# Patient Record
Sex: Female | Born: 1960 | Race: White | Hispanic: No | Marital: Married | Smoking: Never smoker
Health system: Southern US, Community
[De-identification: ages and names within clinical notes are randomized; demographics above are authoritative.]

## PROBLEM LIST (undated history)

## (undated) DIAGNOSIS — E7800 Pure hypercholesterolemia, unspecified: Secondary | ICD-10-CM

## (undated) DIAGNOSIS — G4709 Other insomnia: Secondary | ICD-10-CM

## (undated) DIAGNOSIS — M8580 Other specified disorders of bone density and structure, unspecified site: Secondary | ICD-10-CM

## (undated) DIAGNOSIS — J302 Other seasonal allergic rhinitis: Secondary | ICD-10-CM

## (undated) DIAGNOSIS — C44519 Basal cell carcinoma of skin of other part of trunk: Secondary | ICD-10-CM

## (undated) DIAGNOSIS — I10 Essential (primary) hypertension: Secondary | ICD-10-CM

## (undated) DIAGNOSIS — M797 Fibromyalgia: Secondary | ICD-10-CM

## (undated) DIAGNOSIS — J189 Pneumonia, unspecified organism: Secondary | ICD-10-CM

## (undated) DIAGNOSIS — M1990 Unspecified osteoarthritis, unspecified site: Secondary | ICD-10-CM

## (undated) DIAGNOSIS — K219 Gastro-esophageal reflux disease without esophagitis: Secondary | ICD-10-CM

## (undated) DIAGNOSIS — I700 Atherosclerosis of aorta: Secondary | ICD-10-CM

## (undated) HISTORY — PX: SKIN CANCER EXCISION: SHX779

## (undated) HISTORY — DX: Other seasonal allergic rhinitis: J30.2

## (undated) HISTORY — DX: Pure hypercholesterolemia, unspecified: E78.00

## (undated) HISTORY — PX: KNEE SURGERY: SHX244

## (undated) HISTORY — DX: Fibromyalgia: M79.7

## (undated) HISTORY — DX: Other specified disorders of bone density and structure, unspecified site: M85.80

## (undated) HISTORY — DX: Unspecified osteoarthritis, unspecified site: M19.90

## (undated) HISTORY — DX: Atherosclerosis of aorta: I70.0

## (undated) HISTORY — DX: Other insomnia: G47.09

---

## 1999-09-21 ENCOUNTER — Other Ambulatory Visit: Admission: RE | Admit: 1999-09-21 | Discharge: 1999-09-21 | Payer: Self-pay | Admitting: Gynecology

## 2000-11-19 HISTORY — PX: HYSTEROTOMY: SHX1776

## 2001-01-30 ENCOUNTER — Other Ambulatory Visit: Admission: RE | Admit: 2001-01-30 | Discharge: 2001-01-30 | Payer: Self-pay | Admitting: Gynecology

## 2001-11-19 HISTORY — PX: TUBAL LIGATION: SHX77

## 2001-11-19 HISTORY — PX: OVARY SURGERY: SHX727

## 2002-03-11 ENCOUNTER — Other Ambulatory Visit: Admission: RE | Admit: 2002-03-11 | Discharge: 2002-03-11 | Payer: Self-pay | Admitting: Gynecology

## 2002-05-13 ENCOUNTER — Inpatient Hospital Stay (HOSPITAL_COMMUNITY): Admission: RE | Admit: 2002-05-13 | Discharge: 2002-05-15 | Payer: Self-pay | Admitting: Gynecology

## 2002-05-13 ENCOUNTER — Encounter (INDEPENDENT_AMBULATORY_CARE_PROVIDER_SITE_OTHER): Payer: Self-pay | Admitting: Specialist

## 2003-08-23 ENCOUNTER — Other Ambulatory Visit: Admission: RE | Admit: 2003-08-23 | Discharge: 2003-08-23 | Payer: Self-pay | Admitting: Gynecology

## 2003-10-06 ENCOUNTER — Ambulatory Visit (HOSPITAL_BASED_OUTPATIENT_CLINIC_OR_DEPARTMENT_OTHER): Admission: RE | Admit: 2003-10-06 | Discharge: 2003-10-06 | Payer: Self-pay | Admitting: Gynecology

## 2003-10-06 ENCOUNTER — Encounter (INDEPENDENT_AMBULATORY_CARE_PROVIDER_SITE_OTHER): Payer: Self-pay | Admitting: Specialist

## 2003-10-06 ENCOUNTER — Ambulatory Visit (HOSPITAL_COMMUNITY): Admission: RE | Admit: 2003-10-06 | Discharge: 2003-10-06 | Payer: Self-pay | Admitting: Gynecology

## 2005-02-22 ENCOUNTER — Other Ambulatory Visit: Admission: RE | Admit: 2005-02-22 | Discharge: 2005-02-22 | Payer: Self-pay | Admitting: Gynecology

## 2006-02-26 ENCOUNTER — Other Ambulatory Visit: Admission: RE | Admit: 2006-02-26 | Discharge: 2006-02-26 | Payer: Self-pay | Admitting: Gynecology

## 2008-06-10 ENCOUNTER — Other Ambulatory Visit: Admission: RE | Admit: 2008-06-10 | Discharge: 2008-06-10 | Payer: Self-pay | Admitting: Gynecology

## 2010-07-11 ENCOUNTER — Other Ambulatory Visit: Admission: RE | Admit: 2010-07-11 | Discharge: 2010-07-11 | Payer: Self-pay | Admitting: Gynecology

## 2010-07-11 ENCOUNTER — Ambulatory Visit: Payer: Self-pay | Admitting: Gynecology

## 2011-04-06 NOTE — Op Note (Signed)
[facility]  Patient:    Desiree Randall, Desiree Randall Visit Number: [id number] MRN: [id number]          Service Type: GYN Location: 9300 [id number] [id number] Attending Physician:  Tonye Royalty Dictated by:   Gaetano Hawthorne. Lily Peer, M.D. Proc. Date: 05/13/02 Admit Date:  05/13/2002                             Operative Report  SURGEON:                      Juan H. Lily Peer, M.D.  FIRST ASSISTANT:              Katy Fitch, M.D.  INDICATION FOR OPERATION:     A 50 year old gravida 3, para 2, AB 1, with chronic symptomatic leiomyomatous uteri contributing to her iron deficiency anemia, dysmenorrhea and menorrhagia.  PREOPERATIVE DIAGNOSIS:       1. Symptomatic leiomyomatous uteri (dysmenorrhea and menorrhagia).                               2. Anemia.  POSTOPERATIVE DIAGNOSES:      1. Symptomatic leiomyomatous uteri (dysmenorrhea and menorrhagia).  ANESTHESIA:                   General endotracheal.  PROCEDURES PERFORMED:         Transabdominal hysterectomy with bilateral oophoropexy.  FINDINGS:                     Mild pelvic adhesions.  Leiomyomatous uteri and a left peritubal cyst.  Normal appearing ovaries.  DESCRIPTION OF PROCEDURE:     After the patient was adequately counseled, she was taken to the operating room where she underwent a successful general endotracheal anesthesia.  She was placed in a supine position and the abdomen and vagina were prepped and draped in the usual sterile fashion.  Exam under anesthesia demonstrated 8-10 week sized irregular uterus, thus determining that the appropriate incision would be a Pfannenstiel.  After the Foley catheter was in the place and drapes were in place, a Pfannenstiel skin incision was made 2 cm above the symphysis pubis.  The incision was carried out from skin, subcutaneous tissue and down to the rectus fascia.  A midline nick was made, the fascia was incised in transverse fashion.  The  peritoneal cavity was entered cautiously and the patient was placed in the Trendelenburg position.  The OConnor-Sullivan retractor was placed.  After some mild adhesiolysis in the pelvis, with two Kelly clamps placed and hugging the uterus, the area of the triple pedicle was utilized for traction.  The right round ligament was identified and was transected with Bovie cautery.  The bladder flap was then established by incising the anterior broad ligament down to the level of the internal cervical os.  The right ureter was identified and the surgeons finger penetrated posterior broad ligament.  Then two Kelly clamps hugged the uterus and the utero-ovarian ligament and proximal tube were transected; leaving the right tube and ovary behind.  The pedicle was free tied with 0 Vicryl suture, followed by transfixation of stitches using 0 Vicryl suture.  The parametrial tissue was skeletonized and the remaining broad and cardinal ligaments were serially clamped, cut and suture ligated with 0 Vicryl suture, to the level of the right vaginal fornix.  A  similar procedure was carried down on the contralateral side.  There a left peritubal cyst was excised and passed off the operative field.  Both fornices were entered and the cervix was excised with the vagina passed off the operative field.  Both angles were secured with transfixation stitch of 0 Vicryl suture.  The remaining cuff was secured with interrupted stitches of 0 Vicryl suture. The pelvis was copiously irrigated with normal saline solution; for additional hemostasis Surgicel was placed, due to some mild oozing.  That was contained with additional hemostasis that was placed.  Both ovaries were suspended to the round ligament with 3-0 Vicryl suture.  The sponge and needle count were correct.  The OConnor-Sullivan retractor was removed, and the visceroperitoneum was then reapproximated.  The rectus fascia was closed with a running stitch of 0  Vicryl suture.  The subcutaneous bleeders were Bovie cauterized, followed by placement of Xeroform gauze and 4 x 8 dressing.  The patient was extubated and transferred to the recovery room with stable vital signs.  BLOOD LOSS FROM PROCEDURE:    250 c.  URINE OUTPUT:                 150 cc and clear.  FLUIDS:                       She received a total of 2500 cc lactated Ringers.  Of note, she did receive 1 g Cefotan preoperatively and she did have pneumatic compression stockings for DVT prophylaxis.  Dictated by:   Gaetano Hawthorne Lily Peer, M.D. Attending Physician:  Tonye Royalty DD:  05/13/02 TD:  05/14/02 Job: [id number]

## 2011-04-06 NOTE — H&P (Signed)
NAME:  Desiree Randall, Desiree Randall                      ACCOUNT NO.:  [id number]   MEDICAL RECORD NO.:  [id number]                   PATIENT TYPE:  AMB   LOCATION:  [facility]                                 FACILITY:  [facility]   PHYSICIAN:  Juan H. Lily Peer, M.D.             DATE OF BIRTH:  [birth year]   DATE OF ADMISSION:  DATE OF DISCHARGE:                                HISTORY & PHYSICAL   NOTE:  The patient is scheduled for surgery on Wednesday, November the 17th,  at 8:30 A.M. at [facility].   CHIEF COMPLAINT:  Left adnexal mass.   HISTORY:  The patient is a 50 year old gravida 3, para 2, AB 1 with previous  history of abdominal hysterectomy in June 2003 who was seen in the office on  August 23, 2003 for her annual gynecological examination whereby a fullness  in the left adnexa had been noted.  At that an ultrasound was ordered, which  demonstrated right ovarian tissue measuring 5.3 x 4.2 x 4.2 cm with a thin-  walled cystic mass measuring 59 x 39 x 36 mm within septations, reticular  echo pattern, multiple septations, internal low-level echoes, avascular,  also tubular cystic structure inferior and medial to the ovary, avascular,  with a measurement of 82 x 20 x 21 mm suggestive of a hydrosalpinx with a  second echo-free cyst on the ovary measuring 23 x 7 mm, and was avascular.  The right ovary was normal and the uterus was absent as a result of a  previous hysterectomy.  The patient had tumor markers consistent of CA-125,  HCG and alpha fetoprotein which were normal.  She was given a trial of an  antibiotic, Vibramycin 100 mg b.i.d. for a seven-day course, and she was  also given a shot of Depo-Provera 150 mg IM; and, was to return back a month  later for a repeat ultrasound and, if persistent, to proceed with a planned  diagnostic laparoscopy.   The ultrasound was repeated on November 11th, which demonstrated that there  was still a large, cystic, tortuous tubular  structure medial to the left  ovary measuring 10.9 x 2.0 cm consistent wit a large left hydrosalpinx.  There was complete resolution of the left ovarian cyst.  There was no fluid  in the cul-de-sac and the right ovary was normal.  Due to the fact that the  patient continues to have left lower quadrant pain it was decided to proceed  with a diagnostic laparoscopy, left salpingectomy, possible left salpingo-  oophorectomy.   ALLERGIES:  The patient denies any medical allergies.   PAST OBSTETRICAL HISTORY:  The patient has had two normal spontaneous  vaginal deliveries.   PAST SURGICAL HISTORY:  The patient has had two D&Cs for miscarriages.  She  has had left knee arthroscopic surgery.  She has had a total abdominal  hysterectomy for benign reasons back in 2003.   FAMILY HISTORY:  Hypertension and diabetes in her mother.  Cardiovascular in  grandfather, grandmother and mother.  History of melanoma in her mother and  a grandmother with osteoporosis.   MEDICATIONS:  Current medications consists of Zyrtec p.r.n., multivitamins  daily and calcium supplementation consisting of 1200-1500 mg daily.   PHYSICAL EXAMINATION:  VITAL SIGNS:  The patient weighs 117 pounds and is 5  feet 3.5 inches tall.  Blood pressure 130/78.  HEENT:  Unremarkable.  NECK:  Neck supple.  Trachea midline.  No carotid bruits.  No thyromegaly.  LUNGS:  Lungs clear to auscultation without rhonchi or wheezes.  HEART:  Regular rate and rhythm.  No murmurs or gallops.  BREASTS:  Breast exam at the time of her annual gynecological examination is  reported to be normal.  ABDOMEN:  Abdomen is soft and nontender without rebound or guarding.  PELVIC EXAMINATION:  Bartholin, urethra and Skene glands within normal  limits.  Vagina cuff intact.  Bimanual examination as described above with a  left adnexal fullness confirmed on rectal exam.   ASSESSMENT:  [age]-year-old gravida 3, para 2, abortus 1 with a  persistent left  adnexal mass, resolved with ovarian suppression with Depo-  Provera, but a large hydrosalpinx causing left lower quadrant is still  present.   We will proceed with a diagnostic laparoscopy and a left salpingectomy,  possible left salpingo-oophorectomy, lysis of pelvis adhesions and/or  treatment of endometriosis if identified.  Potential scenarios to include:  If technical difficulties are encountered at the time of laparoscopy open  laparotomy may need to be performed to gain access to the adnexal  structures; and, also, in the event of trauma to internal organs from the  laser, cautery or trocars requiring open laparotomy to correct any internal  injury.  Also the potential risks of hemorrhage and in the event blood  transfusion the patient is fully aware of the potential risks such as  anaphylactic reaction, hepatitis or acquired immune deficiency syndrome from  donor blood and blood products.  Also there is a risk for deep venous  thrombosis and subsequent pulmonary embolism for which she will have  pneumatic compression stocking and for infection prophylactic antibiotic  will vessel utilized as well.   All the above-issues were discussed with the patient.  All questions were  answered and we will follow accordingly.   PLAN:  The patient is scheduled for diagnostic laparoscopy, left  salpingectomy and possible left salpingo-oophorectomy on Wednesday, November  the 17th, at 8:30 A.M. at [facility].                                               Juan H. Lily Peer, M.D.    JHF/MEDQ  D:  10/04/2003  T:  10/05/2003  Job:  [id number]

## 2011-04-06 NOTE — Discharge Summary (Signed)
   NAME:  [name], Desiree Randall                      ACCOUNT NO.:  [id number]   MEDICAL RECORD NO.:  [id number]                   PATIENT TYPE:  NP   LOCATION:  9309                                 FACILITY:  [facility]   PHYSICIAN:  Elwyn Lade . Hancock, N.P.          DATE OF BIRTH:  [birth year]   DATE OF ADMISSION:  05/13/2002  DATE OF DISCHARGE:  05/15/2002                                 DISCHARGE SUMMARY   DISCHARGE DIAGNOSES:  Symptomatic leiomyomata uterus, anemia, dysmenorrhea,  menorrhagia.   PROCEDURE:  Total abdominal hysterectomy with lysis of adhesions.   HISTORY OF PRESENT ILLNESS:  The patient is a 50 year old gravida 3, para 2,  AB1 who was seen in the office on March 11, 2002 for annual gynecologic  examination.  The patient complained of dysmenorrhea, menorrhagia,  tiredness, and fatigue.  Was noted her hemoglobin was 9.3.  She had a normal  platelet count of 216,000.  Anemia work-up was done.  B12 and folate levels  were normal.  Also, patient was noted at the time of her pelvic examination  she had a six to eight week sized irregular uterus.  Ultrasound demonstrated  the uterus measured 12 x 3 x 7 x 3 x 7.8 cm.  Endometrial stripe was 17.8  cm.  Right ovary was normal.  Left ovary was normal except for a dominant  follicle.  The patient also did have an endometrial biopsy to rule out  endometrial cancer or hyperplasia and this was negative.  She is admitted  for definitive therapy, total abdominal hysterectomy with ovarian  conservation.   HOSPITAL COURSE:  The patient was admitted for TAH with ovarian  conservation.  TAH with bilateral oophoropexy was performed by Dr.  Lily Peer, assisted by Dr. Penni Homans under general anesthesia.  Findings  included mild pelvic adhesions, leiomyomatous uterus, a left paratubal cyst,  normal appearing ovaries.  Postoperative course patient remained afebrile,  had no difficulty voiding.  Was able to be discharged in satisfactory  condition  on her second postoperative day.   LABORATORIES:  CBC:  Hematocrit 27.1, hemoglobin 8.7, WBC 5.4, platelets  213,000.   DISPOSITION:  The patient is to follow up in the office in 72 hours to have  staples removed.  Lortab for pain.  Iron q.d.                                               Elwyn Lade . Cato Mulligan, N.P.    MKH/MEDQ  D:  06/18/2002  T:  06/24/2002  Job:  [id number]

## 2011-04-06 NOTE — H&P (Signed)
[facility]  Patient:    Desiree Randall, Desiree Randall Visit Number: [id number] MRN: [id number]          Service Type: GYN Location: 9300 [id number] [id number] Attending Physician:  Tonye Royalty Dictated by:   Gaetano Hawthorne. Lily Peer, M.D. Admit Date:  05/13/2002                           History and Physical  CHIEF COMPLAINT:              1. Dysmenorrhea.                               2. Menorrhagia.                               3. Iron deficiency anemia.                               4. Leiomyomatous uteri.  HISTORY OF PRESENT ILLNESS:   The patient is a 50 year old gravida 3, para 2, AB1 (husband with previous vasectomy) who was seen in the office on March 11, 2002, for her annual gynecologic examination.  She had been complaining of dysmenorrhea, menorrhagia, tiredness and fatigue and it was noted that her hemoglobin was 9.3.  She had a normal platelet count of 216,000.  She had anemia workup which consisted of iron deficiency anemia whereby the total iron level was low at 13, ferritin level was 3, total iron binding capacity was 4. Her vitamin B12 and folate levels were normal, and at the time of the examination it was also noted that she had six to eight weeks size irregular shaped uterus.  She underwent an ultrasound which demonstrated that the uterus measured 12.3 x 7.3 x 7.8 cm and an endometrial stripe of 17.8 cm.  She had submucous myoma noted.  Fluid was within the cavity.  Anterior fundal subserosal myoma measuring 24 x 22 mm was noted as well as the fundal submucous myoma measuring 56 x 39 x 50 mm.  The right ovary was normal.  The left ovary was normal except a dominant follicle which was seen.  The cul-de-sac was negative.  The patient was started on iron supplementation, and she decided that she wanted to proceed with a hysterectomy to relieve some of the above mentioned symptoms as well she was also having frequency of urination as a result of  this enlarged uterus.  PAST MEDICAL HISTORY:         Her husband has had a vasectomy.  ALLERGIES:                    She has no medical allergies.  OBSTETRICAL/GYN HISTORY:      She has had three pregnancies and two children. They were all delivered vaginally.  She had one miscarriage.  Her periods are very, very heavy and they last 7-10 days with a significant amount of cramping.  FAMILY HISTORY:               Paternal grandmother with diabetes. Hypertension was noted in the mother and grandparents.  Heart disease in maternal grandfather, and her mother had melanoma.  PAST SURGICAL HISTORY:        The patients previous surgeries have consisted of knee arthroscopy  in 1990 and 1991.  She takes Zyrtec or Claritin p.r.n.  PHYSICAL EXAMINATION: GENERAL:                      Well-developed, well-nourished female.  VITAL SIGNS:                  Weight 123 pounds, height 5 feet 3 inches tall.  HEENT:                        Unremarkable.  NECK:                         Supple.  Trachea midline.  No carotid bruits, no thyromegaly.  LUNGS:                        Clear to auscultation without rhonchi or wheezes.  HEART:                        Regular rate and rhythm, no murmurs or gallops.  BREASTS:                      Breast examination done at the time of her annual gynecological exam on March 11, 2002, was normal.  ABDOMEN:                      Soft, nontender without rebound or guarding.  PELVIC:                       Bartholins, urethra and skeins within normal limits.  Vagina and cervix: No gross lesions or discharge.  Uterus was rather enlarged, 8-10 weeks size on bimanual exam.  Adnexa:  No palpable masses or tenderness although difficult to evaluate secondary to irregular shaped uterus.  RECTAL:                       Exam was negative.  ASSESSMENT:                   [age]-year-old gravida 3, para 2, AB1 with chronic dysmenorrhea, menorrhagia, iron deficiency anemia as a  result of the leiomyomatous uteri which is also contributing to pressure on her bladder and causing urinary frequency.  The patient has decided to proceed with a total abdominal hysterectomy with ovarian conservation.  Literature was previously provided for the patient to read, and she was seen in the office for preoperative consultation.  She did have an endometrial biopsy to rule out any endometrial cancer or hyperplasia and this was reported to be negative.  This was done on Mar 20, 2002.  On review of her records back in 1998, she had a history of menometrorrhagia as well and she had at that point a benign secretory endometrium.  The patient is fully aware that all efforts will be made for ovarian conversation but in the event that one ovary or both ovaries are diseased that they may require removal, but all efforts will be made for ovarian conservation.  If for medical necessity at the time of hysterectomy it is deemed necessary for ovaries to be removed she is aware that she will need to be placed on hormone replacement therapy for the remainder of her life. Potential complications from the procedure are as follows:  Although recently her last hemoglobin was  10.5 and she was continued on iron supplementation, if she looses a significant amount of blood she may need to receive blood or blood products with its potential risks of anaphylactic reaction.  Hepatitis and AIDS were discussed.  Also, there is a risk of trauma to the abdomen and pelvis such as the bladder, intestines, nerves, blood vessels with need for corrective surgery.  Also there is a risk for deep venous thrombosis and pulmonary embolism.  In an effort to prevent this, we will have the patient have pneumatic compression stockings, and to prevent infection she will receive prophylactic antibiotics as well.  All of these issues were discussed with the patient at length.  Her husband has had a vasectomy in the past  so children are no longer an issue.  She fully accepts and wholeheartedly would like to proceed with the above recommended procedure.  All questions are  answered, and we will follow accordingly.  PLAN:                         The patient is scheduled for total abdominal hysterectomy with ovarian conservation on Wednesday, May 13, 2002, at [facility]. Dictated by:   Gaetano Hawthorne. Lily Peer, M.D. Attending Physician:  Tonye Royalty DD:  05/12/02 TD:  05/12/02 Job: [id number]

## 2011-04-06 NOTE — Op Note (Signed)
NAME:  Desiree Randall, Desiree Randall                      ACCOUNT NO.:  [id number]   MEDICAL RECORD NO.:  [id number]                   PATIENT TYPE:  AMB   LOCATION:  [facility]                                 FACILITY:  [facility]   PHYSICIAN:  Juan H. Lily Peer, M.D.             DATE OF BIRTH:  Mar 31, 1961   DATE OF PROCEDURE:  10/06/2003  DATE OF DISCHARGE:                                 OPERATIVE REPORT   SURGEON:  Juan H. Lily Peer, M.D.   FIRST ASSISTANT:  Daniel L. Eda Paschal, M.D.   INDICATIONS FOR PROCEDURE:  A 50 year old gravida 3, para 2, AB 1 with  previous history of abdominal hysterectomy who on an incidental finding on  an annual gynecological examination was found to have an adnexal mass.   PREOPERATIVE DIAGNOSES:  1. Left adnexal mass.  2. Chronic pelvic pain.   POSTOPERATIVE DIAGNOSES:  1. Chronic pelvic pain.  2. Pelvic adhesions.  3. Left hydrosalpinx.   ANESTHESIA:  General endotracheal anesthesia.   PROCEDURE:  1. Diagnostic laparoscopy.  2. Pelvic adhesiolysis.  3. Left salpingo-oophorectomy.   FINDINGS:  The patient had the adhesions from the sigmoid to the vaginal  cuff as well as an enlarged tortuous left hydrosalpinx encased in adhesions  with the left ovary and to the pelvic sidewall. The right ovary appeared to  be normal. The liver appeared to be smooth surface as well as the external  appearance of the gallbladder, the appendix was not seen.   DESCRIPTION OF PROCEDURE:  After the patient was adequately counseled, she  was taken to the operating room where she underwent a successful general  endotracheal anesthesia. She had received 2 g of Cefotan prophylactically.  She was placed in the low lithotomy position. The bladder was evacuated of  its contents with a red rubber Roxan Hockey for approximately 25 mL. She had  voided before being brought to the operating room. After the abdomen, vagina  and perineum were prepped and draped in the usual sterile fashion, a  small  stab incision was made underneath the umbilicus followed by insertion of the  varus needle. Opening intraabdominal pressure was approximately 5 mmHg and  2.5 liters of carbon dioxide were insufflated into the peritoneal cavity.  The Veress needle was removed. The 10 mm trocar was then inserted, the  sleeve was left in place, trocar removed and the laparoscope was inserted.  In a systematic fashion, the above mentioned findings were noted. Two  additional ports were made approximately four fingerbreadths from the  midline lower abdomen 5 mm trocar were inserted under laparoscopic guidance.  The first portion of the procedure consisted of lysing the adhesions that  were present to the sigmoid, omentum and to the vaginal cuff. With the use  of the tripolar unit, the area was clamped, cut and cauterized and  transected in a serial fashion. Once the cul-de-sac was freed, attention was  then placed to the left tube and ovary again  requiring meticulous dissection  with the tripolar unit in a similar fashion. The left ureter was identified  and distal from the infundibulopelvic ligament the tube and ovary were  placed on traction and the left infundibulopelvic ligament and then the  round ligament were clamped, cauterized and transected with the tripolar  unit as well completely freeing the left tube and ovary. A 5 mm scope was  introduced in one of the 5 mm ports and the laparoscopic basket retrieval  unit was introduced through the 10 mm trocar site and the specimen was  retrieved in toto through the basket through the umbilical 10/11 mm site and  submitted for histological evaluation. The trocar was reinserted again, the  laparoscope was reinserted again, the pelvic cavity was copiously irrigated  with normal saline solution. Inspection of the left infundibulopelvic  ligament appeared to be with good hemostasis as the rest of the pelvic  sidewall and the area of the omentum/sigmoid and  vaginal cuff were good,  hemostasis was present and the instruments were then removed. The carbon  dioxide was removed from the peritoneal cavity. The subumbilical 10/11 mm  trocar fascial site was closed with a figure-of-eight of 3-0 Vicryl suture  and the skin was reapproximated with interrupted sutures of 4-0 plain catgut  suture. The two 5 mm trocar sites were closed with 4-0 plain catgut suture  as well. For postoperative analgesia, she was given a 0.25% Marcaine which  was infiltrated in all three port sites for a total of 10 mL. She did  receive Toradol 30 mg in route to the recovery room. Sponge count and needle  count were correct. The patient was extubated and transferred to the  recovery room with stable vital signs. Blood loss was minimal, fluid  resuscitation consisted of 1100 mL of lactated Ringer's and urine output in  and out catheter was 25 mL. She did receive 2 g of Cefotan prophylactically.                                               Juan H. Lily Peer, M.D.    JHF/MEDQ  D:  10/06/2003  T:  10/06/2003  Job:  [id number]

## 2016-01-23 ENCOUNTER — Ambulatory Visit: Payer: Self-pay | Admitting: Gynecology

## 2016-02-01 ENCOUNTER — Ambulatory Visit: Payer: Self-pay | Admitting: Women's Health

## 2016-02-15 ENCOUNTER — Other Ambulatory Visit: Payer: Self-pay

## 2016-02-15 ENCOUNTER — Ambulatory Visit: Payer: Self-pay | Admitting: Women's Health

## 2016-02-15 DIAGNOSIS — Z1231 Encounter for screening mammogram for malignant neoplasm of breast: Secondary | ICD-10-CM

## 2016-03-01 ENCOUNTER — Ambulatory Visit (INDEPENDENT_AMBULATORY_CARE_PROVIDER_SITE_OTHER): Payer: PRIVATE HEALTH INSURANCE | Admitting: Women's Health

## 2016-03-01 ENCOUNTER — Encounter: Payer: Self-pay | Admitting: Women's Health

## 2016-03-01 VITALS — BP 148/86 | Ht 63.0 in | Wt 127.0 lb

## 2016-03-01 DIAGNOSIS — Z13820 Encounter for screening for osteoporosis: Secondary | ICD-10-CM

## 2016-03-01 DIAGNOSIS — R030 Elevated blood-pressure reading, without diagnosis of hypertension: Secondary | ICD-10-CM

## 2016-03-01 DIAGNOSIS — Z01419 Encounter for gynecological examination (general) (routine) without abnormal findings: Secondary | ICD-10-CM

## 2016-03-01 DIAGNOSIS — G4700 Insomnia, unspecified: Secondary | ICD-10-CM

## 2016-03-01 DIAGNOSIS — N951 Menopausal and female climacteric states: Secondary | ICD-10-CM | POA: Diagnosis not present

## 2016-03-01 DIAGNOSIS — Z90710 Acquired absence of both cervix and uterus: Secondary | ICD-10-CM | POA: Diagnosis not present

## 2016-03-01 DIAGNOSIS — Z79890 Hormone replacement therapy (postmenopausal): Secondary | ICD-10-CM | POA: Diagnosis not present

## 2016-03-01 MED ORDER — ESTRADIOL 0.05 MG/24HR TD PTTW
1.0000 | MEDICATED_PATCH | TRANSDERMAL | Status: DC
Start: 2016-03-01 — End: 2017-06-04

## 2016-03-01 NOTE — Progress Notes (Signed)
RYEISHA CESPEDES 09/18/1961 [id number]    History:    Presents for annual exam.  2004 TAH with [facility] for benign cysts and fibroids with increasing menopausal symptoms over the past year. Numerous hot flushes, poor sleep. Normal Pap history. Had a recent back injury, currently in therapy on prednisone. Blood pressure elevated today, normal in the past reports blood pressures been in the 110 to 120s over 70s this year. Has not had a screening colonoscopy. Overdue for mammogram. Has  annual exam scheduled with her primary for labs.  Past medical history, past surgical history, family history and social history were all reviewed and documented in the EPIC chart. 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 Daughter in PT school, son finishing degree in Engineer, production at [employer].  ROS:  A ROS was performed and pertinent positives and negatives are included.  Exam:  Filed Vitals:   03/01/16 1004  BP: 148/86    General appearance:  Normal Thyroid:  Symmetrical, normal in size, without palpable masses or nodularity. Respiratory  Auscultation:  Clear without wheezing or rhonchi Cardiovascular  Auscultation:  Regular rate, without rubs, murmurs or gallops  Edema/varicosities:  Not grossly evident Abdominal  Soft,nontender, without masses, guarding or rebound.  Liver/spleen:  No organomegaly noted  Hernia:  None appreciated  Skin  Inspection:  Grossly normal   Breasts: Examined lying and sitting.     Right: Without masses, retractions, discharge or axillary adenopathy.     Left: Without masses, retractions, discharge or axillary adenopathy. Gentitourinary   Inguinal/mons:  Normal without inguinal adenopathy  External genitalia:  Normal  BUS/Urethra/Skene's glands:  Normal  Vagina:  Normal  Cervix:  And uterus absent  Adnexa/parametria:     Rt: Without masses or tenderness.   Lt: Without masses or tenderness.  Anus and perineum: Normal  Digital rectal exam: Normal sphincter tone  without palpated masses or tenderness  Assessment/Plan:  55 y.o. MWF G4 P2 for annual exam.     TAH with [facility]-benign Bothersome menopausal symptoms Borderline blood pressure Recent back injury-follow-up with primary orthopedist Anxiety-primary care manages  Plan: Options reviewed for numerous hot flashes. Would like to try estrogen, will start with Vivelle patch twice weekly 0.05, proper use, slight risk for blood clots, strokes, breast cancer reviewed. Instructed to check blood pressure if continues greater than 130/80 best to stop. Instructed to call if no relief of hot flushes. SBE's, annual screening mammogram and is scheduled next month, reviewed importance of annual screen. Screening colonoscopy, [facility] information given instructed to schedule. Encouraged regular exercise, calcium rich diet, vitamin D 2000 daily. Will have vitamin D level checked at annual exam at primary care in June. UA, new screening guidelines reviewed. Marland Kitchen  Huel Cote [facility], 10:58 AM 03/01/2016

## 2016-03-01 NOTE — Patient Instructions (Addendum)
Colonoscopy   Dr Carlean Purl at [facility]  [phone]   Menopause is a normal process in which your reproductive ability comes to an end. This process happens gradually over a span of months to years, usually between the ages of 23 and 86. Menopause is complete when you have missed 12 consecutive menstrual periods. It is important to talk with your health care provider about some of the most common conditions that affect postmenopausal women, such as heart disease, cancer, and bone loss (osteoporosis). Adopting a healthy lifestyle and getting preventive care can help to promote your health and wellness. Those actions can also lower your chances of developing some of these common conditions. WHAT SHOULD I KNOW ABOUT MENOPAUSE? During menopause, you may experience a number of symptoms, such as:  Moderate-to-severe hot flashes.  Night sweats.  Decrease in sex drive.  Mood swings.  Headaches.  Tiredness.  Irritability.  Memory problems.  Insomnia. Choosing to treat or not to treat menopausal changes is an individual decision that you make with your health care provider. WHAT SHOULD I KNOW ABOUT HORMONE REPLACEMENT THERAPY AND SUPPLEMENTS? Hormone therapy products are effective for treating symptoms that are associated with menopause, such as hot flashes and night sweats. Hormone replacement carries certain risks, especially as you become older. If you are thinking about using estrogen or estrogen with progestin treatments, discuss the benefits and risks with your health care provider. WHAT SHOULD I KNOW ABOUT HEART DISEASE AND STROKE? Heart disease, heart attack, and stroke become more likely as you age. This may be due, in part, to the hormonal changes that your body experiences during menopause. These can affect how your body processes dietary fats, triglycerides, and cholesterol. Heart attack and stroke are both medical emergencies. There are many things that you can do to help prevent heart  disease and stroke:  Have your blood pressure checked at least every 1-2 years. High blood pressure causes heart disease and increases the risk of stroke.  If you are 18-84 years old, ask your health care provider if you should take aspirin to prevent a heart attack or a stroke.  Do not use any tobacco products, including cigarettes, chewing tobacco, or electronic cigarettes. If you need help quitting, ask your health care provider.  It is important to eat a healthy diet and maintain a healthy weight.  Be sure to include plenty of vegetables, fruits, low-fat dairy products, and lean protein.  Avoid eating foods that are high in solid fats, added sugars, or salt (sodium).  Get regular exercise. This is one of the most important things that you can do for your health.  Try to exercise for at least 150 minutes each week. The type of exercise that you do should increase your heart rate and make you sweat. This is known as moderate-intensity exercise.  Try to do strengthening exercises at least twice each week. Do these in addition to the moderate-intensity exercise.  Know your numbers.Ask your health care provider to check your cholesterol and your blood glucose. Continue to have your blood tested as directed by your health care provider. WHAT SHOULD I KNOW ABOUT CANCER SCREENING? There are several types of cancer. Take the following steps to reduce your risk and to catch any cancer development as early as possible. Breast Cancer  Practice breast self-awareness.  This means understanding how your breasts normally appear and feel.  It also means doing regular breast self-exams. Let your health care provider know about any changes, no matter how small.  If you are 38 or older, have a clinician do a breast exam (clinical breast exam or CBE) every year. Depending on your age, family history, and medical history, it may be recommended that you also have a yearly breast X-ray (mammogram).  If  you have a family history of breast cancer, talk with your health care provider about genetic screening.  If you are at high risk for breast cancer, talk with your health care provider about having an MRI and a mammogram every year.  Breast cancer (BRCA) gene test is recommended for women who have family members with BRCA-related cancers. Results of the assessment will determine the need for genetic counseling and BRCA1 and for BRCA2 testing. BRCA-related cancers include these types:  Breast. This occurs in males or females.  Ovarian.  Tubal. This may also be called fallopian tube cancer.  Cancer of the abdominal or pelvic lining (peritoneal cancer).  Prostate.  Pancreatic. Cervical, Uterine, and Ovarian Cancer Your health care provider may recommend that you be screened regularly for cancer of the pelvic organs. These include your ovaries, uterus, and vagina. This screening involves a pelvic exam, which includes checking for microscopic changes to the surface of your cervix (Pap test).  For women ages 21-65, health care providers may recommend a pelvic exam and a Pap test every three years. For women ages 9-65, they may recommend the Pap test and pelvic exam, combined with testing for human papilloma virus (HPV), every five years. Some types of HPV increase your risk of cervical cancer. Testing for HPV may also be done on women of any age who have unclear Pap test results.  Other health care providers may not recommend any screening for nonpregnant women who are considered low risk for pelvic cancer and have no symptoms. Ask your health care provider if a screening pelvic exam is right for you.  If you have had past treatment for cervical cancer or a condition that could lead to cancer, you need Pap tests and screening for cancer for at least 20 years after your treatment. If Pap tests have been discontinued for you, your risk factors (such as having a new sexual partner) need to be  reassessed to determine if you should start having screenings again. Some women have medical problems that increase the chance of getting cervical cancer. In these cases, your health care provider may recommend that you have screening and Pap tests more often.  If you have a family history of uterine cancer or ovarian cancer, talk with your health care provider about genetic screening.  If you have vaginal bleeding after reaching menopause, tell your health care provider.  There are currently no reliable tests available to screen for ovarian cancer. Lung Cancer Lung cancer screening is recommended for adults 64-74 years old who are at high risk for lung cancer because of a history of smoking. A yearly low-dose CT scan of the lungs is recommended if you:  Currently smoke.  Have a history of at least 30 pack-years of smoking and you currently smoke or have quit within the past 15 years. A pack-year is smoking an average of one pack of cigarettes per day for one year. Yearly screening should:  Continue until it has been 15 years since you quit.  Stop if you develop a health problem that would prevent you from having lung cancer treatment. Colorectal Cancer  This type of cancer can be detected and can often be prevented.  Routine colorectal cancer screening usually begins at  age 20 and continues through age 38.  If you have risk factors for colon cancer, your health care provider may recommend that you be screened at an earlier age.  If you have a family history of colorectal cancer, talk with your health care provider about genetic screening.  Your health care provider may also recommend using home test kits to check for hidden blood in your stool.  A small camera at the end of a tube can be used to examine your colon directly (sigmoidoscopy or colonoscopy). This is done to check for the earliest forms of colorectal cancer.  Direct examination of the colon should be repeated every 5-10  years until age 32. However, if early forms of precancerous polyps or small growths are found or if you have a family history or genetic risk for colorectal cancer, you may need to be screened more often. Skin Cancer  Check your skin from head to toe regularly.  Monitor any moles. Be sure to tell your health care provider:  About any new moles or changes in moles, especially if there is a change in a mole's shape or color.  If you have a mole that is larger than the size of a pencil eraser.  If any of your family members has a history of skin cancer, especially at a young age, talk with your health care provider about genetic screening.  Always use sunscreen. Apply sunscreen liberally and repeatedly throughout the day.  Whenever you are outside, protect yourself by wearing long sleeves, pants, a wide-brimmed hat, and sunglasses. WHAT SHOULD I KNOW ABOUT OSTEOPOROSIS? Osteoporosis is a condition in which bone destruction happens more quickly than new bone creation. After menopause, you may be at an increased risk for osteoporosis. To help prevent osteoporosis or the bone fractures that can happen because of osteoporosis, the following is recommended:  If you are 8-40 years old, get at least 1,000 mg of calcium and at least 600 mg of vitamin D per day.  If you are older than age 41 but younger than age 55, get at least 1,200 mg of calcium and at least 600 mg of vitamin D per day.  If you are older than age 3, get at least 1,200 mg of calcium and at least 800 mg of vitamin D per day. Smoking and excessive alcohol intake increase the risk of osteoporosis. Eat foods that are rich in calcium and vitamin D, and do weight-bearing exercises several times each week as directed by your health care provider. WHAT SHOULD I KNOW ABOUT HOW MENOPAUSE AFFECTS [place]? Depression may occur at any age, but it is more common as you become older. Common symptoms of depression include:  Low or sad  mood.  Changes in sleep patterns.  Changes in appetite or eating patterns.  Feeling an overall lack of motivation or enjoyment of activities that you previously enjoyed.  Frequent crying spells. Talk with your health care provider if you think that you are experiencing depression. WHAT SHOULD I KNOW ABOUT IMMUNIZATIONS? It is important that you get and maintain your immunizations. These include:  Tetanus, diphtheria, and pertussis (Tdap) booster vaccine.  Influenza every year before the flu season begins.  Pneumonia vaccine.  Shingles vaccine. Your health care provider may also recommend other immunizations.   This information is not intended to replace advice given to you by your health care provider. Make sure you discuss any questions you have with your health care provider.   Document Released: 12/28/2005 Document  Revised: 11/26/2014 Document Reviewed: 07/08/2014 Elsevier Interactive Patient Education 2016 [place] At menopause, your body begins making less estrogen and progesterone hormones. This causes the body to stop having menstrual periods. This is because estrogen and progesterone hormones control your periods and menstrual cycle. A lack of estrogen may cause symptoms such as:  Hot flushes (or hot flashes).  Vaginal dryness.  Dry skin.  Loss of sex drive.  Risk of bone loss (osteoporosis). When this happens, you may choose to take hormone therapy to get back the estrogen lost during menopause. When the hormone estrogen is given alone, it is usually referred to as ET (Estrogen Therapy). When the hormone progestin is combined with estrogen, it is generally called HT (Hormone Therapy). This was formerly known as hormone replacement therapy (HRT). Your caregiver can help you make a decision on what will be best for you. The decision to use HT seems to change often as new studies are done. Many studies do not agree on the benefits of hormone  replacement therapy. LIKELY BENEFITS OF HT INCLUDE PROTECTION FROM:  Hot Flushes (also called hot flashes) - A hot flush is a sudden feeling of heat that spreads over the face and body. The skin may redden like a blush. It is connected with sweats and sleep disturbance. Women going through menopause may have hot flushes a few times a month or several times per day depending on the woman.  Osteoporosis (bone loss) - Estrogen helps guard against bone loss. After menopause, a woman's bones slowly lose calcium and become weak and brittle. As a result, bones are more likely to break. The hip, wrist, and spine are affected most often. Hormone therapy can help slow bone loss after menopause. Weight bearing exercise and taking calcium with vitamin D also can help prevent bone loss. There are also medications that your caregiver can prescribe that can help prevent osteoporosis.  Vaginal dryness - Loss of estrogen causes changes in the vagina. Its lining may become thin and dry. These changes can cause pain and bleeding during sexual intercourse. Dryness can also lead to infections. This can cause burning and itching. (Vaginal estrogen treatment can help relieve pain, itching, and dryness.)  Urinary tract infections are more common after menopause because of lack of estrogen. Some women also develop urinary incontinence because of low estrogen levels in the vagina and bladder.  Possible other benefits of estrogen include a positive effect on mood and short-term memory in women. RISKS AND COMPLICATIONS  Using estrogen alone without progesterone causes the lining of the uterus to grow. This increases the risk of lining of the uterus (endometrial) cancer. Your caregiver should give another hormone called progestin if you have a uterus.  Women who take combined (estrogen and progestin) HT appear to have an increased risk of breast cancer. The risk appears to be small, but increases throughout the time that HT is  taken.  Combined therapy also makes the breast tissue slightly denser which makes it harder to read mammograms (breast X-rays).  Combined, estrogen and progesterone therapy can be taken together every day, in which case there may be spotting of blood. HT therapy can be taken cyclically in which case you will have menstrual periods. Cyclically means HT is taken for a set amount of days, then not taken, then this process is repeated.  HT may increase the risk of stroke, heart attack, breast cancer and forming blood clots in your leg.  Transdermal estrogen (estrogen that is absorbed through  the skin with a patch or a cream) may have better results with:  Cholesterol.  Blood pressure.  Blood clots. Having the following conditions may indicate you should not have HT:  Endometrial cancer.  Liver disease.  Breast cancer.  Heart disease.  History of blood clots.  Stroke. TREATMENT   If you choose to take HT and have a uterus, usually estrogen and progestin are prescribed.  Your caregiver will help you decide the best way to take the medications.  Possible ways to take estrogen include:  Pills.  Patches.  Gels.  Sprays.  Vaginal estrogen cream, rings and tablets.  It is best to take the lowest dose possible that will help your symptoms and take them for the shortest period of time that you can.  Hormone therapy can help relieve some of the problems (symptoms) that affect women at menopause. Before making a decision about HT, talk to your caregiver about what is best for you. Be well informed and comfortable with your decisions. HOME CARE INSTRUCTIONS   Follow your caregivers advice when taking the medications.  A Pap test is done to screen for cervical cancer.  The first Pap test should be done at age 78.  Between ages 61 and 74, Pap tests are repeated every 2 years.  Beginning at age 29, you are advised to have a Pap test every 3 years as long as the past 3 Pap  tests have been normal.  Some women have medical problems that increase the chance of getting cervical cancer. Talk to your caregiver about these problems. It is especially important to talk to your caregiver if a new problem develops soon after your last Pap test. In these cases, your caregiver may recommend more frequent screening and Pap tests.  The above recommendations are the same for women who have or have not gotten the vaccine for HPV (human papillomavirus).  If you had a hysterectomy for a problem that was not a cancer or a condition that could lead to cancer, then you no longer need Pap tests. However, even if you no longer need a Pap test, a regular exam is a good idea to make sure no other problems are starting.  If you are between ages 57 and 56, and you have had normal Pap tests going back 10 years, you no longer need Pap tests. However, even if you no longer need a Pap test, a regular exam is a good idea to make sure no other problems are starting.  If you have had past treatment for cervical cancer or a condition that could lead to cancer, you need Pap tests and screening for cancer for at least 20 years after your treatment.  If Pap tests have been discontinued, risk factors (such as a new sexual partner)need to be re-assessed to determine if screening should be resumed.  Some women may need screenings more often if they are at high risk for cervical cancer.  Get mammograms done as per the advice of your caregiver. SEEK IMMEDIATE MEDICAL CARE IF:  You develop abnormal vaginal bleeding.  You have pain or swelling in your legs, shortness of breath, or chest pain.  You develop dizziness or headaches.  You have lumps or changes in your breasts or armpits.  You have slurred speech.  You develop weakness or numbness of your arms or legs.  You have pain, burning, or bleeding when urinating.  You develop abdominal pain.   This information is not intended to replace  advice  given to you by your health care provider. Make sure you discuss any questions you have with your health care provider.   Document Released: 08/04/2003 Document Revised: 03/22/2015 Document Reviewed: 05/09/2015 Elsevier Interactive Patient Education [employer].

## 2016-03-30 ENCOUNTER — Ambulatory Visit: Payer: Self-pay

## 2016-04-20 ENCOUNTER — Other Ambulatory Visit: Payer: Self-pay | Admitting: Women's Health

## 2016-04-20 ENCOUNTER — Ambulatory Visit
Admission: RE | Admit: 2016-04-20 | Discharge: 2016-04-20 | Disposition: A | Discharge status: Home / Self Care | Payer: PRIVATE HEALTH INSURANCE | Source: Ambulatory Visit

## 2016-04-20 DIAGNOSIS — Z1231 Encounter for screening mammogram for malignant neoplasm of breast: Secondary | ICD-10-CM

## 2016-04-20 DIAGNOSIS — R928 Other abnormal and inconclusive findings on diagnostic imaging of breast: Secondary | ICD-10-CM

## 2016-04-24 ENCOUNTER — Other Ambulatory Visit: Payer: Self-pay | Admitting: Women's Health

## 2016-04-24 ENCOUNTER — Ambulatory Visit (INDEPENDENT_AMBULATORY_CARE_PROVIDER_SITE_OTHER): Payer: PRIVATE HEALTH INSURANCE

## 2016-04-24 ENCOUNTER — Encounter: Payer: Self-pay | Admitting: Gynecology

## 2016-04-24 DIAGNOSIS — Z13820 Encounter for screening for osteoporosis: Secondary | ICD-10-CM | POA: Diagnosis not present

## 2016-04-24 DIAGNOSIS — M899 Disorder of bone, unspecified: Secondary | ICD-10-CM | POA: Diagnosis not present

## 2016-04-24 DIAGNOSIS — M8580 Other specified disorders of bone density and structure, unspecified site: Secondary | ICD-10-CM

## 2016-04-27 ENCOUNTER — Ambulatory Visit
Admission: RE | Admit: 2016-04-27 | Discharge: 2016-04-27 | Disposition: A | Discharge status: Home / Self Care | Payer: PRIVATE HEALTH INSURANCE | Source: Ambulatory Visit | Attending: Women's Health | Admitting: Women's Health

## 2016-04-27 DIAGNOSIS — R928 Other abnormal and inconclusive findings on diagnostic imaging of breast: Secondary | ICD-10-CM

## 2016-09-11 ENCOUNTER — Other Ambulatory Visit: Payer: Self-pay | Admitting: Radiology

## 2016-09-11 ENCOUNTER — Telehealth: Payer: Self-pay | Admitting: Radiology

## 2016-09-11 NOTE — Telephone Encounter (Signed)
Patient needs follow up appt with Dr Estanislado Pandy pls call to make appt.

## 2016-09-11 NOTE — Telephone Encounter (Signed)
Error

## 2016-09-11 NOTE — Telephone Encounter (Signed)
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

## 2016-09-11 NOTE — Telephone Encounter (Signed)
I have called patient / I have gotten notice from [place] She should be aware the Cymbalta and Tramadol together increase her chances of having Seratonin syndrome, which is a very serious reaction.

## 2016-09-12 ENCOUNTER — Telehealth: Payer: Self-pay | Admitting: Rheumatology

## 2016-09-12 MED ORDER — TRAMADOL HCL 50 MG PO TABS: 50.0000 mg | ORAL_TABLET | Freq: Two times a day (BID) | ORAL | 1 refills | Status: DC

## 2016-09-12 NOTE — Telephone Encounter (Signed)
Patient also needs refill for Tramadol sent to [facility] [address]. They have not received the rx.

## 2016-09-12 NOTE — Telephone Encounter (Signed)
OK to continue? 

## 2016-09-12 NOTE — Telephone Encounter (Signed)
I have called patient to advise her of the interaction with Tramadol and Cymbalta.   Patient uses 4 Tramadol per day / and Cymbalta 60mg   She has used this together for 4 years without difficulty She states she is fine and has had no side effects.  08/08/16 last visit 12/19/15 UDS Narcotic agreement 12/13/15 12/31/16 next visit  Ok to refill Tramadol ?

## 2016-09-12 NOTE — Telephone Encounter (Signed)
See previous message/ request sent

## 2016-09-12 NOTE — Telephone Encounter (Signed)
Patient called about a letter she received from her ins company about Celebrex refill. Patient states the pharmacy gave her a 90 day supply on Sept 19th, so when they tried refilling it a few days ago, it would not authorize but patient states she realized it was because she had been prescribed the 90 day supply.

## 2016-11-06 ENCOUNTER — Other Ambulatory Visit: Payer: Self-pay | Admitting: Rheumatology

## 2016-11-07 ENCOUNTER — Other Ambulatory Visit: Payer: Self-pay | Admitting: *Deleted

## 2016-11-07 MED ORDER — TRAMADOL HCL 50 MG PO TABS: ORAL_TABLET | ORAL | 0 refills | Status: DC

## 2016-11-07 NOTE — Telephone Encounter (Signed)
08/08/16 last visit 12/19/15 UDS Narcotic agreement 12/13/15 12/31/16 next visit  Ok to refill Tramadol?

## 2016-11-07 NOTE — Telephone Encounter (Signed)
ok 

## 2016-11-08 ENCOUNTER — Other Ambulatory Visit: Payer: Self-pay | Admitting: *Deleted

## 2016-11-25 ENCOUNTER — Other Ambulatory Visit: Payer: Self-pay | Admitting: Rheumatology

## 2016-11-26 ENCOUNTER — Other Ambulatory Visit: Payer: Self-pay | Admitting: *Deleted

## 2016-11-26 NOTE — Telephone Encounter (Signed)
Last visit: 08/08/16 Next Visit: 12/31/16 Labs: 08/07/16 WNL  Okay to refill Ambien?

## 2016-11-28 ENCOUNTER — Telehealth: Payer: Self-pay | Admitting: Rheumatology

## 2016-11-28 NOTE — Telephone Encounter (Signed)
Clarified prescription for the pharmacy.

## 2016-11-28 NOTE — Telephone Encounter (Signed)
[facility] called and has questions about Zolpidem rx. Please call [phone]

## 2016-12-05 ENCOUNTER — Other Ambulatory Visit: Payer: Self-pay | Admitting: Rheumatology

## 2016-12-10 NOTE — Telephone Encounter (Signed)
08/08/16 last visit 12/19/15 UDS Narcotic agreement 12/13/15 12/31/16 next visit ok to RF Tramadol ?

## 2016-12-12 ENCOUNTER — Other Ambulatory Visit: Payer: Self-pay | Admitting: Rheumatology

## 2016-12-12 NOTE — Telephone Encounter (Signed)
Prescription was okayed to refill on 12/10/16 by Mr Carlyon Shadow, [place]. Prescription was given as a verbal to the pharmacy because they did not receive the fax we sent and patient was in store to pick up prescription.

## 2016-12-27 DIAGNOSIS — M19042 Primary osteoarthritis, left hand: Secondary | ICD-10-CM

## 2016-12-27 DIAGNOSIS — R5383 Other fatigue: Secondary | ICD-10-CM | POA: Insufficient documentation

## 2016-12-27 DIAGNOSIS — F5101 Primary insomnia: Secondary | ICD-10-CM | POA: Insufficient documentation

## 2016-12-27 DIAGNOSIS — M797 Fibromyalgia: Secondary | ICD-10-CM | POA: Insufficient documentation

## 2016-12-27 DIAGNOSIS — M19041 Primary osteoarthritis, right hand: Secondary | ICD-10-CM | POA: Insufficient documentation

## 2016-12-27 DIAGNOSIS — M170 Bilateral primary osteoarthritis of knee: Secondary | ICD-10-CM | POA: Insufficient documentation

## 2016-12-27 NOTE — Progress Notes (Signed)
Office Visit Note  Patient: Desiree Randall             Date of Birth: July 30, 1961           MRN: [id number]             PCP: Marjorie Smolder, MD Referring: No ref. provider found Visit Date: 12/31/2016 Occupation: @GUAROCC @    Subjective:  Follow-up Fibromyalgia, fatigue, insomnia. Also having weakness in bilateral hands lately causing patient to drop things. Worried that it could be rheumatoid arthritis secondary to family history.  History of Present Illness: Desiree Randall is a 56 y.o. female  Last seen 08/08/2016. Patient's fibromyalgia has worsened over the last 2 months. She rates her new discomfort as 6 on a scale of 0-10. She was unable to get adequate rest secondary to her pain. In the past she was doing well with her fibromyalgia and she rated her discomfort as a 3. She reports that she had to stop the Celebrex because she ended up developing an ulcer for which she is a was prescribed pantoprazole. She is doing well with the ulcer currently. She suspects that because the pain management has decreased, her fiber pain is more prominent.  Her fatigue and insomnia are ongoing. She does get good relief with Ambien or Flexeril. She does not use both at one time. She alternates between these 2 medications.  Her main complaint today is that she is dropping things and breaking things because of weakness in her hands. She is worried that rheumatoid arthritis could be the cause of this and wants to rule that out. She recalls getting autoimmune workup in the past but she wants to insure that nothing has developed over time. Her daughter, getting a doctor in physical therapy, also advised the patient to get this addressed during today's office visit if possible.  She continues to take all of her other medications as prescribed including Cymbalta, tramadol, Ambien, Flexeril. As noted above she has stopped her Celebrex due to the ulcer and is on pantoprazole with good relief.     Activities of Daily Living:  Patient reports morning stiffness for 15 minutes.   Patient Denies nocturnal pain.  Difficulty dressing/grooming: Denies Difficulty climbing stairs: Denies Difficulty getting out of chair: Denies Difficulty using hands for taps, buttons, cutlery, and/or writing: Reports   Review of Systems  Constitutional: Positive for fatigue.  HENT: Negative for mouth sores and mouth dryness.   Eyes: Negative for dryness.  Respiratory: Negative for shortness of breath.   Gastrointestinal: Negative for constipation and diarrhea.  Musculoskeletal: Positive for myalgias and myalgias.  Skin: Negative for sensitivity to sunlight.  Psychiatric/Behavioral: Positive for sleep disturbance. Negative for decreased concentration.    PMFS History:  Patient Active Problem List   Diagnosis Date Noted  . Fibromyalgia 12/27/2016  . Other fatigue 12/27/2016  . Primary insomnia 12/27/2016  . Primary osteoarthritis of both hands 12/27/2016  . Primary osteoarthritis of both knees 12/27/2016    Past Medical History:  Diagnosis Date  . Fibromyalgia   . Osteopenia     Family History  Problem Relation Age of Onset  . Melanoma Mother   . Heart disease Mother   . Hypertension Mother   . Heart disease Father     TRIPLE BYPASS  . Hypercholesterolemia Father   . Heart attack Maternal Grandfather   . Stroke Maternal Grandfather   . Aneurysm Maternal Grandfather   . Heart failure Maternal Grandmother   . Heart attack Paternal  Grandmother   . Heart disease Paternal Grandfather   . Kidney Stones Brother    Past Surgical History:  Procedure Laterality Date  . HYSTEROTOMY  2002  . KNEE SURGERY  1990-1991  . OVARY SURGERY  2003   LEFT OVARY REMOVAL  . TUBAL LIGATION  2003   LEFT FALLOPIAN TUBE REMOVED   Social History   Social History Narrative  . No narrative on file     Objective: Vital Signs: BP 102/62   Pulse 78   Ht 5' 3"  (1.6 m)   Wt 133 lb (60.3 kg)   BMI  23.56 kg/m    Physical Exam  Constitutional: She is oriented to person, place, and time. She appears well-developed and well-nourished.  HENT:  Head: Normocephalic and atraumatic.  Eyes: EOM are normal. Pupils are equal, round, and reactive to light.  Cardiovascular: Normal rate, regular rhythm and normal heart sounds.  Exam reveals no gallop and no friction rub.   No murmur heard. Pulmonary/Chest: Effort normal and breath sounds normal. She has no wheezes. She has no rales.  Abdominal: Soft. Bowel sounds are normal. She exhibits no distension. There is no tenderness. There is no guarding. No hernia.  Musculoskeletal: Normal range of motion. She exhibits no edema, tenderness or deformity.  Lymphadenopathy:    She has no cervical adenopathy.  Neurological: She is alert and oriented to person, place, and time. Coordination normal.  Skin: Skin is warm and dry. Capillary refill takes less than 2 seconds. No rash noted.  Psychiatric: She has a normal mood and affect. Her behavior is normal.  Nursing note and vitals reviewed.    Musculoskeletal Exam:  Full range of motion of all joints Grip strength is equal and strong bilaterally Fiber myalgia tender points are 18 out of 18 positive She does have DIP PIP prominence bilaterally consistent with OA.   CDAI Exam: CDAI Homunculus Exam:   Joint Counts:  CDAI Tender Joint count: 0 CDAI Swollen Joint count: 0  Global Assessments:  Patient Global Assessment: 6 Provider Global Assessment: 6  CDAI Calculated Score: 12  No synovitis on examination to bilateral wrist or MCP joints. In September 2017 visit, she did not have any synovitis at that time either.   Investigation: Findings:  11/2007 CBC with diff, sed rate, comprehensive metabolic panel, CPK, TSH, rheumatoid factor, ANA, urinalysis, CCP, and HLA-B27 which were all negative.   10/02/2013 X-rays of the bilateral knees were done today and show moderate medial compartment  narrowing.      No visits with results within 6 Month(s) from this visit.  Latest known visit with results is:  No results found for any previous visit.   Patient's last labs that we have noted is 08/06/2016 that had CBC with differential and CMP with GFR normal.   Imaging: No results found.  Speciality Comments: No specialty comments available.    Procedures:  No procedures performed Allergies: Patient has no known allergies.   Assessment / Plan:     Visit Diagnoses: Fibromyalgia  Other fatigue  Primary insomnia  Primary osteoarthritis of both hands  Primary osteoarthritis of both knees  Encounter for therapeutic drug monitoring - Plan: Pain Mgmt, Profile 5 w/Conf, U   Plan: #1: Fibromyalgia. Patient rates her discomfort as a 6 on a scale of 0-10. She had trouble over the last 2 months in December and January and was having more pain than usual. Currently she is doing a little bit better.  #2: One of the main  complaints patient has today is that she is dropping things area Her daughter, a physical therapist in training, was concerned that since there is a family history of rheumatoid arthritis, they want to make sure that that is not an issue. Reviewing the patient's chart shows that in 2009, we did autoimmune workup which was all negative. As a result I will order an ultrasound for bilateral hands and wrist to make sure that there is no early signs of rheumatoid arthritis.  #3: If ultrasound is negative, consider reordering autoimmune workup to rule out rheumatoid arthritis. Patient is having weakness in hands to the point where she is dropping things, breaking things,. She also states that during Christmas, she was cutting some vegetables and she accidentally lost control overnight due to weakness in her right hand and "slice my finger". It was quite deep according to the patient. With this new set of symptoms, we definitely want to rule out any rheumatological  issues that could be causing this type of problem. If all of that is negative, we will be happy to refer the patient for neurology.  #4: We discussed trigger point injections in the future with the patient should she need it. Because she had about December and January, we could've helped her mitigate some of that discomfort.  #5: Will schedule an ultrasound at the next available for bilateral hands and wrist to rule out synovitis and we'll see the patient back in 5 months for regular follow-up.  Orders: Orders Placed This Encounter  Procedures  . Pain Mgmt, Profile 5 w/Conf, U   Meds ordered this encounter  Medications  . diclofenac sodium (VOLTAREN) 1 % GEL    Sig: Voltaren Gel 3 grams to 3 large joints upto TID 3 TUBES with 3 refills    Dispense:  5 Tube    Refill:  3    Voltaren Gel 3 grams to 3 large joints upto TID 3 TUBES with 3 refills    Order Specific Question:   Supervising Provider    AnswerEliezer Lofts [id number]  . zolpidem (AMBIEN) 10 MG tablet    Sig: Take 1 tablet (10 mg total) by mouth at bedtime as needed.    Dispense:  90 tablet    Refill:  1  . traMADol (ULTRAM) 50 MG tablet    Sig: TAKE 1-2 TABLETS BY MOUTH TWICE DAILY AS NEEDED    Dispense:  120 tablet    Refill:  2  . cyclobenzaprine (FLEXERIL) 10 MG tablet    Sig: Take 1 tablet (10 mg total) by mouth at bedtime. prn    Dispense:  90 tablet    Refill:  1  . DULoxetine (CYMBALTA) 60 MG capsule    Sig: Take 1 capsule (60 mg total) by mouth daily.    Dispense:  90 capsule    Refill:  1    Face-to-face time spent with patient was 30 minutes. 50% of time was spent in counseling and coordination of care.  Follow-Up Instructions: Return in about 5 months (around 05/30/2017) for FMS, FATIGUE,INSOMNIA,bil gr tr bursitis;.   Eliezer Lofts, PA-C  Note - This record has been created using [employer].  Chart creation errors have been sought, but may not always  have been located. Such creation  errors do not reflect on  the standard of medical care.

## 2016-12-31 ENCOUNTER — Encounter: Payer: Self-pay | Admitting: Rheumatology

## 2016-12-31 ENCOUNTER — Ambulatory Visit (INDEPENDENT_AMBULATORY_CARE_PROVIDER_SITE_OTHER): Payer: 59 | Admitting: Rheumatology

## 2016-12-31 VITALS — BP 102/62 | HR 78 | Ht 63.0 in | Wt 133.0 lb

## 2016-12-31 DIAGNOSIS — M170 Bilateral primary osteoarthritis of knee: Secondary | ICD-10-CM

## 2016-12-31 DIAGNOSIS — M797 Fibromyalgia: Secondary | ICD-10-CM

## 2016-12-31 DIAGNOSIS — Z5181 Encounter for therapeutic drug level monitoring: Secondary | ICD-10-CM

## 2016-12-31 DIAGNOSIS — R5383 Other fatigue: Secondary | ICD-10-CM

## 2016-12-31 DIAGNOSIS — M19042 Primary osteoarthritis, left hand: Secondary | ICD-10-CM

## 2016-12-31 DIAGNOSIS — F5101 Primary insomnia: Secondary | ICD-10-CM | POA: Diagnosis not present

## 2016-12-31 DIAGNOSIS — M19041 Primary osteoarthritis, right hand: Secondary | ICD-10-CM | POA: Diagnosis not present

## 2016-12-31 MED ORDER — CYCLOBENZAPRINE HCL 10 MG PO TABS: 10.0000 mg | ORAL_TABLET | Freq: Every day | ORAL | 1 refills | Status: DC

## 2016-12-31 MED ORDER — TRAMADOL HCL 50 MG PO TABS: ORAL_TABLET | ORAL | 2 refills | Status: DC

## 2016-12-31 MED ORDER — DULOXETINE HCL 60 MG PO CPEP: 60.0000 mg | ORAL_CAPSULE | Freq: Every day | ORAL | 1 refills | Status: DC

## 2016-12-31 MED ORDER — ZOLPIDEM TARTRATE 10 MG PO TABS: 10.0000 mg | ORAL_TABLET | Freq: Every evening | ORAL | 1 refills | Status: DC | PRN

## 2016-12-31 MED ORDER — DICLOFENAC SODIUM 1 % TD GEL: TRANSDERMAL | 3 refills | Status: DC

## 2016-12-31 NOTE — Patient Instructions (Signed)
========================= Hip Exercises Ask your health care provider which exercises are safe for you. Do exercises exactly as told by your health care provider and adjust them as directed. It is normal to feel mild stretching, pulling, tightness, or discomfort as you do these exercises, but you should stop right away if you feel sudden pain or your pain gets worse.Do not begin these exercises until told by your health care provider. STRETCHING AND RANGE OF MOTION EXERCISES  These exercises warm up your muscles and joints and improve the movement and flexibility of your hip. These exercises also help to relieve pain, numbness, and tingling. Exercise A: Hamstrings, Supine  1. Lie on your back. 2. Loop a belt or towel over the ball of your left / rightfoot. The ball of your foot is on the walking surface, right under your toes. 3. Straighten your left / rightknee and slowly pull on the belt to raise your leg.  Do not let your left / right knee bend while you do this.  Keep your other leg flat on the floor.  Raise the left / right leg until you feel a gentle stretch behind your left / right knee or thigh. 4. Hold this position for __________ seconds. 5. Slowly return your leg to the starting position. Repeat __________ times. Complete this stretch __________ times a day. Exercise B: Hip Rotators  1. Lie on your back on a firm surface. 2. Hold your left / right knee with your left / right hand. Hold your ankle with your other hand. 3. Gently pull your left / right knee and rotate your lower leg toward your other shoulder.  Pull until you feel a stretch in your buttocks.  Keep your hips and shoulders firmly planted while you do this stretch. 4. Hold this position for __________ seconds. Repeat __________ times. Complete this stretch __________ times a day. Exercise C: V-Sit (Hamstrings and Adductors)  1. Sit on the floor with your legs extended in a large "V" shape. Keep your  knees straight during this exercise. 2. Start with your head and chest upright, then bend at your waist to reach for your left foot (position A). You should feel a stretch in your right inner thigh. 3. Hold this position for __________ seconds. Then slowly return to the upright position. 4. Bend at your waist to reach forward (position B). You should feel a stretch behind both of your thighs and knees. 5. Hold this position for __________ seconds. Then slowly return to the upright position. 6. Bend at your waist to reach for your right foot (position C). You should feel a stretch in your left inner thigh. 7. Hold this position for __________ seconds. Then slowly return to the upright position. Repeat __________ times. Complete this stretch __________ times a day. Exercise D: Lunge (Hip Flexors)  1. Place your left / right knee on the floor and bend your other knee so that is directly over your ankle. You should be half-kneeling. 2. Keep good posture with your head over your shoulders. 3. Tighten your buttocks to point your tailbone downward. This helps your back to keep from arching too much. 4. You should feel a gentle stretch in the front of your left / right thigh and hip. If you do not feel any resistance, slightly slide your other foot forward and then slowly lunge forward so your knee once again lines up over your ankle. 5. Make sure your tailbone continues to point downward. 6. Hold  this position for __________ seconds. Repeat __________ times. Complete this stretch __________ times a day. STRENGTHENING EXERCISES  These exercises build strength and endurance in your hip. Endurance is the ability to use your muscles for a long time, even after they get tired. Exercise E: Bridge (Hip Extensors)  1. Lie on your back on a firm surface with your knees bent and your feet flat on the floor. 2. Tighten your buttocks muscles and lift your bottom off the floor until the trunk of your body is level  with your thighs.  Do not arch your back.  You should feel the muscles working in your buttocks and the back of your thighs. If you do not feel these muscles, slide your feet 1-2 inches (2.5-5 cm) farther away from your buttocks. 3. Hold this position for __________ seconds. 4. Slowly lower your hips to the starting position. 5. Let your muscles relax completely between repetitions. 6. If this exercise is too easy, try doing it with your arms crossed over your chest. Repeat __________ times. Complete this exercise __________ times a day. Exercise F: Straight Leg Raises - Hip Abductors  1. Lie on your side with your left / right leg in the top position. Lie so your head, shoulder, knee, and hip line up with each other. You may bend your bottom knee to help you balance. 2. Roll your hips slightly forward, so your hips are stacked directly over each other and your left / right knee is facing forward. 3. Leading with your heel, lift your top leg 4-6 inches (10-15 cm). You should feel the muscles in your outer hip lifting.  Do not let your foot drift forward.  Do not let your knee roll toward the ceiling. 4. Hold this position for __________ seconds. 5. Slowly return to the starting position. 6. Let your muscles relax completely between repetitions. Repeat __________ times. Complete this exercise __________ times a day. Exercise G: Straight Leg Raises - Hip Adductors  1. Lie on your side with your left / right leg in the bottom position. Lie so your head, shoulder, knee, and hip line up. You may place your upper foot in front to help you balance. 2. Roll your hips slightly forward, so your hips are stacked directly over each other and your left / right knee is facing forward. 3. Tense the muscles in your inner thigh and lift your bottom leg 4-6 inches (10-15 cm). 4. Hold this position for __________ seconds. 5. Slowly return to the starting position. 6. Let your muscles relax completely  between repetitions. Repeat __________ times. Complete this exercise __________ times a day. Exercise H: Straight Leg Raises - Quadriceps  1. Lie on your back with your left / right leg extended and your other knee bent. 2. Tense the muscles in the front of your left / right thigh. When you do this, you should see your kneecap slide up or see increased dimpling just above your knee. 3. Tighten these muscles even more and raise your leg 4-6 inches (10-15 cm) off the floor. 4. Hold this position for __________ seconds. 5. Keep these muscles tense as you lower your leg. 6. Relax the muscles slowly and completely between repetitions. Repeat __________ times. Complete this exercise __________ times a day. Exercise I: Hip Abductors, Standing 1. Tie one end of a rubber exercise band or tubing to a secure surface, such as a table or pole. 2. Loop the other end of the band or tubing around your left /  right ankle. 3. Keeping your ankle with the band or tubing directly opposite of the secured end, step away until there is tension in the tubing or band. Hold onto a chair as needed for balance. 4. Lift your left / right leg out to your side. While you do this:  Keep your back upright.  Keep your shoulders over your hips.  Keep your toes pointing forward.  Make sure to use your hip muscles to lift your leg. Do not "throw" your leg or tip your body to lift your leg. 5. Hold this position for __________ seconds. 6. Slowly return to the starting position. Repeat __________ times. Complete this exercise __________ times a day. Exercise J: Squats (Quadriceps) 1. Stand in a door frame so your feet and knees are in line with the frame. You may place your hands on the frame for balance. 2. Slowly bend your knees and lower your hips like you are going to sit in a chair.  Keep your lower legs in a straight-up-and-down position.  Do not let your hips go lower than your knees.  Do not bend your knees lower  than told by your health care provider.  If your hip pain increases, do not bend as low. 3. Hold this position for ___________ seconds. 4. Slowly push with your legs to return to standing. Do not use your hands to pull yourself to standing. Repeat __________ times. Complete this exercise __________ times a day. This information is not intended to replace advice given to you by your health care provider. Make sure you discuss any questions you have with your health care provider. Document Released: 11/23/2005 Document Revised: 07/30/2016 Document Reviewed: 10/31/2015 Elsevier Interactive Patient Education  2017 [place].   =========   Knee Exercises Ask your health care provider which exercises are safe for you. Do exercises exactly as told by your health care provider and adjust them as directed. It is normal to feel mild stretching, pulling, tightness, or discomfort as you do these exercises, but you should stop right away if you feel sudden pain or your pain gets worse.Do not begin these exercises until told by your health care provider. STRETCHING AND RANGE OF MOTION EXERCISES  These exercises warm up your muscles and joints and improve the movement and flexibility of your knee. These exercises also help to relieve pain, numbness, and tingling. Exercise A: Knee Extension, Prone 1. Lie on your abdomen on a bed. 2. Place your left / right knee just beyond the edge of the surface so your knee is not on the bed. You can put a towel under your left / right thigh just above your knee for comfort. 3. Relax your leg muscles and allow gravity to straighten your knee. You should feel a stretch behind your left / right knee. 4. Hold this position for __________ seconds. 5. Scoot up so your knee is supported between repetitions. Repeat __________ times. Complete this stretch __________ times a day. Exercise B: Knee Flexion, Active  1. Lie on your back with both knees straight. If this causes  back discomfort, bend your left / right knee so your foot is flat on the floor. 2. Slowly slide your left / right heel back toward your buttocks until you feel a gentle stretch in the front of your knee or thigh. 3. Hold this position for __________ seconds. 4. Slowly slide your left / right heel back to the starting position. Repeat __________ times. Complete this exercise __________ times a day. Exercise  C: Quadriceps, Prone  8. Lie on your abdomen on a firm surface, such as a bed or padded floor. Lyle your left / right knee and hold your ankle. If you cannot reach your ankle or pant leg, loop a belt around your foot and grab the belt instead. 10. Gently pull your heel toward your buttocks. Your knee should not slide out to the side. You should feel a stretch in the front of your thigh and knee. 11. Hold this position for __________ seconds. Repeat __________ times. Complete this stretch __________ times a day. Exercise D: Hamstring, Supine 1. Lie on your back. 2. Loop a belt or towel over the ball of your left / right foot. The ball of your foot is on the walking surface, right under your toes. 3. Straighten your left / right knee and slowly pull on the belt to raise your leg until you feel a gentle stretch behind your knee.  Do not let your left / right knee bend while you do this.  Keep your other leg flat on the floor. 4. Hold this position for __________ seconds. Repeat __________ times. Complete this stretch __________ times a day. STRENGTHENING EXERCISES  These exercises build strength and endurance in your knee. Endurance is the ability to use your muscles for a long time, even after they get tired. Exercise E: Quadriceps, Isometric  1. Lie on your back with your left / right leg extended and your other knee bent. Put a rolled towel or small pillow under your knee if told by your health care provider. 2. Slowly tense the muscles in the front of your left / right thigh. You  should see your kneecap slide up toward your hip or see increased dimpling just above the knee. This motion will push the back of the knee toward the floor. 3. For __________ seconds, keep the muscle as tight as you can without increasing your pain. 4. Relax the muscles slowly and completely. Repeat __________ times. Complete this exercise __________ times a day. Exercise F: Straight Leg Raises - Quadriceps 1. Lie on your back with your left / right leg extended and your other knee bent. 2. Tense the muscles in the front of your left / right thigh. You should see your kneecap slide up or see increased dimpling just above the knee. Your thigh may even shake a bit. 3. Keep these muscles tight as you raise your leg 4-6 inches (10-15 cm) off the floor. Do not let your knee bend. 4. Hold this position for __________ seconds. 5. Keep these muscles tense as you lower your leg. 6. Relax your muscles slowly and completely after each repetition. Repeat __________ times. Complete this exercise __________ times a day. Exercise G: Hamstring, Isometric 7. Lie on your back on a firm surface. 8. Bend your left / right knee approximately __________ degrees. 9. Dig your left / right heel into the surface as if you are trying to pull it toward your buttocks. Tighten the muscles in the back of your thighs to dig as hard as you can without increasing any pain. 10. Hold this position for __________ seconds. 11. Release the tension gradually and allow your muscles to relax completely for __________ seconds after each repetition. Repeat __________ times. Complete this exercise __________ times a day. Exercise H: Hamstring Curls  If told by your health care provider, do this exercise while wearing ankle weights. Begin with __________ weights. Then increase the weight by 1 lb (0.5 kg) increments. Do  not wear ankle weights that are more than __________. 7. Lie on your abdomen with your legs straight. 8. Bend your left /  right knee as far as you can without feeling pain. Keep your hips flat against the floor. 9. Hold this position for __________ seconds. 10. Slowly lower your leg to the starting position. Repeat __________ times. Complete this exercise __________ times a day. Exercise I: Squats (Quadriceps) 7. Stand in front of a table, with your feet and knees pointing straight ahead. You may rest your hands on the table for balance but not for support. 8. Slowly bend your knees and lower your hips like you are going to sit in a chair.  Keep your weight over your heels, not over your toes.  Keep your lower legs upright so they are parallel with the table legs.  Do not let your hips go lower than your knees.  Do not bend lower than told by your health care provider.  If your knee pain increases, do not bend as low. 9. Hold the squat position for __________ seconds. 10. Slowly push with your legs to return to standing. Do not use your hands to pull yourself to standing. Repeat __________ times. Complete this exercise __________ times a day. Exercise J: Wall Slides (Quadriceps)  1. Lean your back against a smooth wall or door while you walk your feet out 18-24 inches (46-61 cm) from it. 2. Place your feet hip-width apart. 3. Slowly slide down the wall or door until your knees bend __________ degrees. Keep your knees over your heels, not over your toes. Keep your knees in line with your hips. 4. Hold for __________ seconds. Repeat __________ times. Complete this exercise __________ times a day. Exercise K: Straight Leg Raises - Hip Abductors 1. Lie on your side with your left / right leg in the top position. Lie so your head, shoulder, knee, and hip line up. You may bend your bottom knee to help you keep your balance. 2. Roll your hips slightly forward so your hips are stacked directly over each other and your left / right knee is facing forward. 3. Leading with your heel, lift your top leg 4-6 inches  (10-15 cm). You should feel the muscles in your outer hip lifting.  Do not let your foot drift forward.  Do not let your knee roll toward the ceiling. 4. Hold this position for __________ seconds. 5. Slowly return your leg to the starting position. 6. Let your muscles relax completely after each repetition. Repeat __________ times. Complete this exercise __________ times a day. Exercise L: Straight Leg Raises - Hip Extensors 1. Lie on your abdomen on a firm surface. You can put a pillow under your hips if that is more comfortable. 2. Tense the muscles in your buttocks and lift your left / right leg about 4-6 inches (10-15 cm). Keep your knee straight as you lift your leg. 3. Hold this position for __________ seconds. 4. Slowly lower your leg to the starting position. 5. Let your leg relax completely after each repetition. Repeat __________ times. Complete this exercise __________ times a day. This information is not intended to replace advice given to you by your health care provider. Make sure you discuss any questions you have with your health care provider. Document Released: 09/19/2005 Document Revised: 07/30/2016 Document Reviewed: 09/11/2015 Elsevier Interactive Patient Education  2017 [place].     ===   ====   ====     ======     ======  Hand Exercises Introduction Hand exercises can be helpful to almost anyone. These exercises can strengthen the hands, improve flexibility and movement, and increase blood flow to the hands. These results can make work and daily tasks easier. Hand exercises can be especially helpful for people who have joint pain from arthritis or have nerve damage from overuse (carpal tunnel syndrome). These exercises can also help people who have injured a hand. Most of these hand exercises are fairly gentle stretching routines. You can do them often throughout the day. Still, it is a good idea to ask your health care provider which exercises would be best for  you. Warming your hands before exercise may help to reduce stiffness. You can do this with gentle massage or by placing your hands in warm water for 15 minutes. Also, make sure you pay attention to your level of hand pain as you begin an exercise routine. Exercises Knuckle Bend  Repeat this exercise 5-10 times with each hand. 6. Stand or sit with your arm, hand, and all five fingers pointed straight up. Make sure your wrist is straight. 7. Gently and slowly bend your fingers down and inward until the tips of your fingers are touching the tops of your palm. 8. Hold this position for a few seconds. 9. Extend your fingers out to their original position, all pointing straight up again. Finger Fan  Repeat this exercise 5-10 times with each hand. 5. Hold your arm and hand out in front of you. Keep your wrist straight. 6. Squeeze your hand into a fist. 7. Hold this position for a few seconds. 8. Edison Simon out, or spread apart, your hand and fingers as much as possible, stretching every joint fully. Tabletop  Repeat this exercise 5-10 times with each hand. 12. Stand or sit with your arm, hand, and all five fingers pointed straight up. Make sure your wrist is straight. 13. Gently and slowly bend your fingers at the knuckles where they meet the hand until your hand is making an upside-down L shape. Your fingers should form a tabletop. 14. Hold this position for a few seconds. 15. Extend your fingers out to their original position, all pointing straight up again. Making Os  Repeat this exercise 5-10 times with each hand. 7. Stand or sit with your arm, hand, and all five fingers pointed straight up. Make sure your wrist is straight. 8. Make an O shape by touching your pointer finger to your thumb. Hold for a few seconds. Then open your hand wide. 9. Repeat this motion with each finger on your hand. Table Spread  Repeat this exercise 5-10 times with each hand. 5. Place your hand on a table with your palm  facing down. Make sure your wrist is straight. 6. Spread your fingers out as much as possible. Hold this position for a few seconds. 7. Slide your fingers back together again. Hold for a few seconds. Ball Grip  Repeat this exercise 10-15 times with each hand. 7. Hold a tennis ball or another soft ball in your hand. 8. While slowly increasing pressure, squeeze the ball as hard as possible. 9. Squeeze as hard as you can for 3-5 seconds. 10. Relax and repeat. Wrist Curls  Repeat this exercise 10-15 times with each hand. 12. Sit in a chair that has armrests. 13. Hold a light weight in your hand, such as a dumbbell that weighs 1-3 pounds (0.5-1.4 kg). Ask your health care provider what weight would be best for you. 14. Rest your hand  just over the end of the chair arm with your palm facing up. 15. Gently pivot your wrist up and down while holding the weight. Do not twist your wrist from side to side. Contact a health care provider if:  Your hand pain or discomfort gets much worse when you do an exercise.  Your hand pain or discomfort does not improve within 2 hours after you exercise. If you have any of these problems, stop doing these exercises right away. Do not do them again unless your health care provider says that you can. Get help right away if:  You develop sudden, severe hand pain. If this happens, stop doing these exercises right away. Do not do them again unless your health care provider says that you can. This information is not intended to replace advice given to you by your health care provider. Make sure you discuss any questions you have with your health care provider. Document Released: 10/17/2015 Document Revised: 04/12/2016 Document Reviewed: 05/16/2015  2017 [employer]   Supplements for OA Natural anti-inflammatories  You can purchase these at [employer], [employer] or online.  . Turmeric (capsules)  . Ginger (ginger root or capsules)  . Omega 3 (Fish, flax  seeds, chia seeds, walnuts, almonds)  . Tart cherry (dried or extract)   Patient should be under the care of a physician while taking these supplements. This may not be reproduced without the permission of Dr. Bo Merino.

## 2017-01-01 LAB — PAIN MGMT, PROFILE 5 W/CONF, U
AMPHETAMINES: NEGATIVE ng/mL (ref <500)
BARBITURATES: NEGATIVE ng/mL (ref <300)
Benzodiazepines: NEGATIVE ng/mL (ref <100)
COCAINE METABOLITE: NEGATIVE ng/mL (ref <150)
Creatinine: 78.1 mg/dL (ref >=20.0)
METHADONE METABOLITE: NEGATIVE ng/mL (ref <100)
Marijuana Metabolite: NEGATIVE ng/mL (ref <20)
OXIDANT: NEGATIVE ug/mL (ref <200)
Opiates: NEGATIVE ng/mL (ref <100)
Oxycodone: NEGATIVE ng/mL (ref <100)
PH: 6.75 (ref 4.5–9.0)

## 2017-01-01 NOTE — Progress Notes (Signed)
Drug screen shows patient is using tramadol for pain management and no other drugsOkay to refill tramadol if due

## 2017-02-13 ENCOUNTER — Ambulatory Visit: Payer: 59 | Admitting: Rheumatology

## 2017-04-03 ENCOUNTER — Encounter: Payer: Self-pay | Admitting: Gynecology

## 2017-04-04 ENCOUNTER — Other Ambulatory Visit: Payer: Self-pay | Admitting: Rheumatology

## 2017-04-04 NOTE — Telephone Encounter (Signed)
Last Visit: 12/31/16 Next Visit: 06/03/17 UDS: 12/31/16  Narc Agreement: 12/31/16

## 2017-04-04 NOTE — Telephone Encounter (Signed)
Reviewed with Mr. Marcellus Scott and he authorized refill

## 2017-06-02 NOTE — Progress Notes (Signed)
Office Visit Note  Patient: Desiree Randall             Date of Birth: Sep 16, 1961           MRN: [id number]             PCP: Darcus Austin, MD Referring: Darcus Austin, MD Visit Date: 06/04/2017 Occupation: @GUAROCC @    Subjective:  Increased pain.   History of Present Illness: Desiree Randall is a 56 y.o. female with history of fibromyalgia and osteoarthritis. She states she recently did some activity and had increased pain after that. She was in a lot of discomfort yesterday especially in her lower back and some generalized pain. The pain is eased off to some extent today. She's been having increased pain in her hands and feet and also some discomfort in her knee joints. Patient reports that her pain level on scale of 0-10 has been about 9-8 without tramadol and the tramadol to come down to 5- 6. The tramadol helps her function through the day.  Activities of Daily Living:  Patient reports morning stiffness for 2-3 hours.   Patient Reports nocturnal pain.  Difficulty dressing/grooming: Denies Difficulty climbing stairs: Reports Difficulty getting out of chair: Reports Difficulty using hands for taps, buttons, cutlery, and/or writing: Reports   Review of Systems  Constitutional: Positive for fatigue. Negative for night sweats, weight gain, weight loss and weakness.  HENT: Negative for mouth sores, trouble swallowing, trouble swallowing, mouth dryness and nose dryness.   Eyes: Positive for dryness. Negative for pain, redness and visual disturbance.  Respiratory: Negative for cough, shortness of breath and difficulty breathing.   Cardiovascular: Negative for chest pain, palpitations, hypertension, irregular heartbeat and swelling in legs/feet.  Gastrointestinal: Negative for blood in stool, constipation and diarrhea.  Endocrine: Negative for increased urination.  Genitourinary: Negative for vaginal dryness.  Musculoskeletal: Positive for arthralgias, joint pain, myalgias,  morning stiffness and myalgias. Negative for joint swelling, muscle weakness and muscle tenderness.  Skin: Negative for color change, rash, hair loss, skin tightness, ulcers and sensitivity to sunlight.  Allergic/Immunologic: Negative for susceptible to infections.  Neurological: Negative for dizziness, memory loss and night sweats.  Hematological: Negative for swollen glands.  Psychiatric/Behavioral: Positive for sleep disturbance. Negative for depressed mood. The patient is not nervous/anxious.     PMFS History:  Patient Active Problem List   Diagnosis Date Noted  . Fibromyalgia 12/27/2016  . Other fatigue 12/27/2016  . Primary insomnia 12/27/2016  . Primary osteoarthritis of both hands 12/27/2016  . Primary osteoarthritis of both knees 12/27/2016    Past Medical History:  Diagnosis Date  . Fibromyalgia   . Osteoarthritis   . Osteopenia     Family History  Problem Relation Age of Onset  . Melanoma Mother   . Heart disease Mother   . Hypertension Mother   . Heart attack Mother   . Heart disease Father        TRIPLE BYPASS  . Hypercholesterolemia Father   . Heart attack Maternal Grandfather   . Stroke Maternal Grandfather   . Aneurysm Maternal Grandfather   . Heart failure Maternal Grandmother   . Heart attack Paternal Grandmother   . Heart disease Paternal Grandfather   . Kidney Stones Brother    Past Surgical History:  Procedure Laterality Date  . HYSTEROTOMY  2002  . KNEE SURGERY  1990-1991  . OVARY SURGERY  2003   LEFT OVARY REMOVAL  . TUBAL LIGATION  2003   LEFT FALLOPIAN  TUBE REMOVED   Social History   Social History Narrative  . No narrative on file     Objective: Vital Signs: BP 128/73 (BP Location: Left Arm, Patient Position: Sitting, Cuff Size: Normal)   Pulse 82   Resp 12   Ht 5\' 3"  (1.6 m)   Wt 134 lb (60.8 kg)   BMI 23.74 kg/m    Physical Exam  Constitutional: She is oriented to person, place, and time. She appears well-developed and  well-nourished.  HENT:  Head: Normocephalic and atraumatic.  Eyes: Conjunctivae and EOM are normal.  Neck: Normal range of motion.  Cardiovascular: Normal rate, regular rhythm, normal heart sounds and intact distal pulses.   Pulmonary/Chest: Effort normal and breath sounds normal.  Abdominal: Soft. Bowel sounds are normal.  Lymphadenopathy:    She has no cervical adenopathy.  Neurological: She is alert and oriented to person, place, and time.  Skin: Skin is warm and dry. Capillary refill takes less than 2 seconds.  Psychiatric: She has a normal mood and affect. Her behavior is normal.  Nursing note and vitals reviewed.    Musculoskeletal Exam: C-spine and thoracic lumbar spine good range of motion. Shoulder joints although joints wrist joints MCPs PIPs DIPs with good range of motion. She had DIP thickening bilaterally with no synovitis. Hip joints knee joints ankles MTPs PIPs DIPs with good range of motion. She had worse MTP and bilateral DIP changes consistent with osteoarthritis.  CDAI Exam: No CDAI exam completed.    Investigation: Findings:  February 2018 UDS c/w    Imaging: No results found.  Speciality Comments: No specialty comments available.    Procedures:  No procedures performed Allergies: Patient has no known allergies.   Assessment / Plan:     Visit Diagnoses: Primary osteoarthritis of both hands: Joint protection and muscle strengthening was discussed. She continues to have ongoing pain and discomfort in her hands.  Primary osteoarthritis of both knees: She complains of increased knee joint discomfort. No warmth swelling or effusion was noted.  Primary osteoarthritis of both feet: Proper fitting shoes were discussed.  Fibromyalgia: She had recent flare of fibromyalgia. She complains of increase hyperalgesia and discomfort. We discussed different treatment options and side effects including Lyrica. She's interested in trying Lyrica. We will call in a  prescription for Lyrica 50 minutes grams by mouth twice a day .  Other fatigue: She has chronic fatigue.  Primary insomnia: Her insomnia is better with medications.  chronic pain - On tramadol 50 mg 1-2 tablets by mouth twice a day when necessary. She states her pain level is not tolerable without tramadol. We discussed if her pain is better controlled on Lyrica she might be  able to taper off tramadol.   Orders: No orders of the defined types were placed in this encounter.  Meds ordered this encounter  Medications  . tiZANidine (ZANAFLEX) 4 MG tablet    Sig: Take 1 tablet (4 mg total) by mouth at bedtime.    Dispense:  30 tablet    Refill:  4  . pregabalin (LYRICA) 50 MG capsule    Sig: Take 1 capsule (50 mg total) by mouth 2 (two) times daily.    Dispense:  60 capsule    Refill:  3    Face-to-face time spent with patient was 30 minutes. 50% of time was spent in counseling and coordination of care.  Follow-Up Instructions: Return for Osteoarthritis FMS.   Bo Merino, MD  Note - This record has been  created using [employer].  Chart creation errors have been sought, but may not always  have been located. Such creation errors do not reflect on  the standard of medical care.

## 2017-06-03 ENCOUNTER — Ambulatory Visit: Payer: 59 | Admitting: Rheumatology

## 2017-06-04 ENCOUNTER — Encounter: Payer: Self-pay | Admitting: Rheumatology

## 2017-06-04 ENCOUNTER — Ambulatory Visit (INDEPENDENT_AMBULATORY_CARE_PROVIDER_SITE_OTHER): Payer: 59 | Admitting: Rheumatology

## 2017-06-04 VITALS — BP 128/73 | HR 82 | Resp 12 | Ht 63.0 in | Wt 134.0 lb

## 2017-06-04 DIAGNOSIS — G8929 Other chronic pain: Secondary | ICD-10-CM | POA: Diagnosis not present

## 2017-06-04 DIAGNOSIS — M19041 Primary osteoarthritis, right hand: Secondary | ICD-10-CM | POA: Diagnosis not present

## 2017-06-04 DIAGNOSIS — F5101 Primary insomnia: Secondary | ICD-10-CM | POA: Diagnosis not present

## 2017-06-04 DIAGNOSIS — M19042 Primary osteoarthritis, left hand: Secondary | ICD-10-CM | POA: Diagnosis not present

## 2017-06-04 DIAGNOSIS — R5383 Other fatigue: Secondary | ICD-10-CM | POA: Diagnosis not present

## 2017-06-04 DIAGNOSIS — M19072 Primary osteoarthritis, left ankle and foot: Secondary | ICD-10-CM

## 2017-06-04 DIAGNOSIS — M19071 Primary osteoarthritis, right ankle and foot: Secondary | ICD-10-CM | POA: Diagnosis not present

## 2017-06-04 DIAGNOSIS — M170 Bilateral primary osteoarthritis of knee: Secondary | ICD-10-CM | POA: Diagnosis not present

## 2017-06-04 DIAGNOSIS — M797 Fibromyalgia: Secondary | ICD-10-CM | POA: Diagnosis not present

## 2017-06-04 MED ORDER — PREGABALIN 50 MG PO CAPS: 50.0000 mg | ORAL_CAPSULE | Freq: Two times a day (BID) | ORAL | 3 refills | Status: DC

## 2017-06-04 MED ORDER — TIZANIDINE HCL 4 MG PO TABS: 4.0000 mg | ORAL_TABLET | Freq: Every day | ORAL | 4 refills | Status: DC

## 2017-06-04 NOTE — Progress Notes (Signed)
Patient was prescribed Lyrica during today's visit.  Patient will be discontinuing tramadol.  Counseled patient on purpose, proper use, and adverse effects of Lyrica.  Patient was given handout on Lyrica.    Elisabeth Most, Pharm.D., BCPS, CPP Clinical Pharmacist Pager: [phone] Phone: [phone] 06/04/2017 11:59 AM

## 2017-06-04 NOTE — Patient Instructions (Signed)
Pregabalin capsules What is this medicine? PREGABALIN (pre GAB a lin) is used to treat nerve pain from diabetes, shingles, spinal cord injury, and fibromyalgia. It is also used to control seizures in epilepsy. This medicine may be used for other purposes; ask your health care provider or pharmacist if you have questions. COMMON BRAND NAME(S): Lyrica What should I tell my health care provider before I take this medicine? They need to know if you have any of these conditions: -bleeding problems -heart disease, including heart failure -history of alcohol or drug abuse -kidney disease -suicidal thoughts, plans, or attempt; a previous suicide attempt by you or a family member -an unusual or allergic reaction to pregabalin, gabapentin, other medicines, foods, dyes, or preservatives -pregnant or trying to get pregnant or trying to conceive with your partner -breast-feeding How should I use this medicine? Take this medicine by mouth with a glass of water. Follow the directions on the prescription label. You can take this medicine with or without food. Take your doses at regular intervals. Do not take your medicine more often than directed. Do not stop taking except on your doctor's advice. A special MedGuide will be given to you by the pharmacist with each prescription and refill. Be sure to read this information carefully each time. Talk to your pediatrician regarding the use of this medicine in children. Special care may be needed. Overdosage: If you think you have taken too much of this medicine contact a poison control center or emergency room at once. NOTE: This medicine is only for you. Do not share this medicine with others. What if I miss a dose? If you miss a dose, take it as soon as you can. If it is almost time for your next dose, take only that dose. Do not take double or extra doses. What may interact with this medicine? -alcohol -certain medicines for blood pressure like captopril,  enalapril, or lisinopril -certain medicines for diabetes, like pioglitazone or rosiglitazone -certain medicines for anxiety or sleep -narcotic medicines for pain This list may not describe all possible interactions. Give your health care provider a list of all the medicines, herbs, non-prescription drugs, or dietary supplements you use. Also tell them if you smoke, drink alcohol, or use illegal drugs. Some items may interact with your medicine. What should I watch for while using this medicine? Tell your doctor or healthcare professional if your symptoms do not start to get better or if they get worse. Visit your doctor or health care professional for regular checks on your progress. Do not stop taking except on your doctor's advice. You may develop a severe reaction. Your doctor will tell you how much medicine to take. Wear a medical identification bracelet or chain if you are taking this medicine for seizures, and carry a card that describes your disease and details of your medicine and dosage times. You may get drowsy or dizzy. Do not drive, use machinery, or do anything that needs mental alertness until you know how this medicine affects you. Do not stand or sit up quickly, especially if you are an older patient. This reduces the risk of dizzy or fainting spells. Alcohol may interfere with the effect of this medicine. Avoid alcoholic drinks. If you have a heart condition, like congestive heart failure, and notice that you are retaining water and have swelling in your hands or feet, contact your health care provider immediately. The use of this medicine may increase the chance of suicidal thoughts or actions. Pay special attention   to how you are responding while on this medicine. Any worsening of mood, or thoughts of suicide or dying should be reported to your health care professional right away. This medicine has caused reduced sperm counts in some men. This may interfere with the ability to father a  child. You should talk to your doctor or health care professional if you are concerned about your fertility. Women who become pregnant while using this medicine for seizures may enroll in the [place] Antiepileptic Drug Pregnancy Registry by calling 1-888-233-2334. This registry collects information about the safety of antiepileptic drug use during pregnancy. What side effects may I notice from receiving this medicine? Side effects that you should report to your doctor or health care professional as soon as possible: -allergic reactions like skin rash, itching or hives, swelling of the face, lips, or tongue -breathing problems -changes in vision -chest pain -confusion -jerking or unusual movements of any part of your body -loss of memory -muscle pain, tenderness, or weakness -suicidal thoughts or other mood changes -swelling of the ankles, feet, hands -unusual bruising or bleeding Side effects that usually do not require medical attention (report to your doctor or health care professional if they continue or are bothersome): -dizziness -drowsiness -dry mouth -headache -nausea -tremors -trouble sleeping -weight gain This list may not describe all possible side effects. Call your doctor for medical advice about side effects. You may report side effects to FDA at 1-800-FDA-1088. Where should I keep my medicine? Keep out of the reach of children. This medicine can be abused. Keep your medicine in a safe place to protect it from theft. Do not share this medicine with anyone. Selling or giving away this medicine is dangerous and against the law. This medicine may cause accidental overdose and death if it taken by other adults, children, or pets. Mix any unused medicine with a substance like cat litter or coffee grounds. Then throw the medicine away in a sealed container like a sealed bag or a coffee can with a lid. Do not use the medicine after the expiration date. Store at room  temperature between 15 and 30 degrees C (59 and 86 degrees F). NOTE: This sheet is a summary. It may not cover all possible information. If you have questions about this medicine, talk to your doctor, pharmacist, or health care provider.  2018 Elsevier/Gold Standard (2015-12-08 10:26:12)  

## 2017-06-05 ENCOUNTER — Telehealth: Payer: Self-pay | Admitting: Pharmacist

## 2017-06-05 NOTE — Telephone Encounter (Signed)
I was asked to call patient to discuss tramadol taper.  I attempted to call patient and left a message asking her to call me back.    Elisabeth Most, Pharm.D., BCPS, CPP Clinical Pharmacist Pager: [phone] Phone: [phone] 06/05/2017 4:01 PM

## 2017-06-10 NOTE — Telephone Encounter (Signed)
Had a discussion with patient regarding starting Lyrica and tapering off tramadol.  Patient reports she has not started Lyrica yet as she was waiting until she finished tramadol prescription.  Advised patient to start Lyrica.  Discussed tapering tramadol by 1 tablet every 3-4 days after starting Lyrica until she is off tramadol.  Patient voiced understanding and denies any questions about the plan.   Elisabeth Most, Pharm.D., BCPS, CPP Clinical Pharmacist Pager: [phone] Phone: [phone] 06/10/2017 2:51 PM

## 2017-06-29 ENCOUNTER — Other Ambulatory Visit: Payer: Self-pay | Admitting: Rheumatology

## 2017-07-01 NOTE — Telephone Encounter (Signed)
Last Visit: 06/04/17 Next Visit: 10/14/17  Okay to refill per Dr. Estanislado Pandy

## 2017-07-04 ENCOUNTER — Other Ambulatory Visit: Payer: Self-pay | Admitting: Rheumatology

## 2017-07-04 NOTE — Telephone Encounter (Signed)
ok 

## 2017-07-04 NOTE — Telephone Encounter (Signed)
06/04/17 last visit  10/14/17 next visit  UDS 12/31/16 Narcotic agreement 12/31/16 Ok to refill Tramadol ? 

## 2017-07-20 ENCOUNTER — Other Ambulatory Visit: Payer: Self-pay | Admitting: Rheumatology

## 2017-07-23 NOTE — Telephone Encounter (Signed)
06/04/17 last visit  10/14/17 next visit   Okay to refill Ambien?

## 2017-07-23 NOTE — Telephone Encounter (Signed)
ok 

## 2017-09-12 ENCOUNTER — Other Ambulatory Visit: Payer: Self-pay | Admitting: Rheumatology

## 2017-09-12 NOTE — Telephone Encounter (Signed)
ok 

## 2017-09-12 NOTE — Telephone Encounter (Signed)
06/04/17 last visit  10/14/17 next visit  UDS 12/31/16 Narcotic agreement 12/31/16 Ok to refill Tramadol ?

## 2017-10-14 ENCOUNTER — Ambulatory Visit: Payer: 59 | Admitting: Rheumatology

## 2017-10-16 ENCOUNTER — Other Ambulatory Visit: Payer: Self-pay | Admitting: Rheumatology

## 2017-10-16 NOTE — Telephone Encounter (Signed)
ok 

## 2017-10-16 NOTE — Telephone Encounter (Signed)
06/04/17 last visit  11/25/17 next visit  UDS 12/31/16 Narcotic agreement 12/31/16 Ok to refill Tramadol ?

## 2017-10-21 ENCOUNTER — Other Ambulatory Visit: Payer: Self-pay | Admitting: Rheumatology

## 2017-10-21 NOTE — Telephone Encounter (Signed)
06/04/17 last visit  Next Visit: 11/25/17  Okay to refill Ambien?

## 2017-10-21 NOTE — Telephone Encounter (Signed)
ok 

## 2017-11-13 NOTE — Progress Notes (Signed)
Office Visit Note  Patient: Desiree Randall             Date of Birth: 1961/04/26           MRN: [id number]             PCP: Darcus Austin, MD Referring: Darcus Austin, MD Visit Date: 11/25/2017 Occupation: @GUAROCC @    Subjective:  Generalized pain- better   History of Present Illness: Desiree Randall is a 56 y.o. female with history of fibromyalgia and osteoarthritis. She states she has tried Lyrica after the last visit but did not see a great response from that. She believes the best response she gets is from tramadol when she takes 100 mg twice a day. She continues to have pain and discomfort on Lyrica and low-dose tramadol combination. She alternates between Flexeril and Ambien for insomnia. She tried tizanidine but could not see any benefit from that. She's still taking Cymbalta 60 mg a day. She has generalized pain. On the scale of 0-10 her pain is about 9 and the tramadol 100 mg twice a day her pain level comes down to about 5. She wants to discontinue Lyrica.  Activities of Daily Living:  Patient reports morning stiffness for 2-3 hours.   Patient Reports nocturnal pain.  Difficulty dressing/grooming: Denies Difficulty climbing stairs: Reports Difficulty getting out of chair: Reports Difficulty using hands for taps, buttons, cutlery, and/or writing: Reports   Review of Systems  Constitutional: Positive for fatigue. Negative for night sweats, weight gain, weight loss and weakness.  HENT: Positive for mouth dryness. Negative for mouth sores, trouble swallowing, trouble swallowing and nose dryness.   Eyes: Positive for dryness. Negative for pain, redness and visual disturbance.  Respiratory: Negative for cough, shortness of breath and difficulty breathing.   Cardiovascular: Positive for palpitations. Negative for chest pain, hypertension, irregular heartbeat and swelling in legs/feet.  Gastrointestinal: Negative for blood in stool, constipation and diarrhea.  Endocrine:  Negative for increased urination.  Genitourinary: Negative for vaginal dryness.  Musculoskeletal: Negative for arthralgias, joint pain, joint swelling, myalgias, muscle weakness, morning stiffness, muscle tenderness and myalgias.  Skin: Negative for color change, rash, hair loss, skin tightness, ulcers and sensitivity to sunlight.  Allergic/Immunologic: Negative for susceptible to infections.  Neurological: Negative for dizziness, memory loss and night sweats.  Hematological: Negative for swollen glands.  Psychiatric/Behavioral: Positive for sleep disturbance. Negative for depressed mood. The patient is not nervous/anxious.     PMFS History:  Patient Active Problem List   Diagnosis Date Noted  . Fibromyalgia 12/27/2016  . Other fatigue 12/27/2016  . Primary insomnia 12/27/2016  . Primary osteoarthritis of both hands 12/27/2016  . Primary osteoarthritis of both knees 12/27/2016    Past Medical History:  Diagnosis Date  . Fibromyalgia   . Osteoarthritis   . Osteopenia     Family History  Problem Relation Age of Onset  . Melanoma Mother   . Heart disease Mother   . Hypertension Mother   . Heart attack Mother   . Heart disease Father        TRIPLE BYPASS  . Hypercholesterolemia Father   . Heart attack Maternal Grandfather   . Stroke Maternal Grandfather   . Aneurysm Maternal Grandfather   . Heart failure Maternal Grandmother   . Heart attack Paternal Grandmother   . Heart disease Paternal Grandfather   . Kidney Stones Brother    Past Surgical History:  Procedure Laterality Date  . HYSTEROTOMY  2002  . KNEE  SURGERY  1990-1991  . OVARY SURGERY  2003   LEFT OVARY REMOVAL  . TUBAL LIGATION  2003   LEFT FALLOPIAN TUBE REMOVED   Social History   Social History Narrative  . Not on file     Objective: Vital Signs: BP 139/78 (BP Location: Left Arm, Patient Position: Sitting, Cuff Size: Normal)   Pulse 76   Resp 16   Ht 5\' 3"  (1.6 m)   Wt 131 lb (59.4 kg)   BMI 23.21  kg/m    Physical Exam  Constitutional: She is oriented to person, place, and time. She appears well-developed and well-nourished.  HENT:  Head: Normocephalic and atraumatic.  Eyes: Conjunctivae and EOM are normal.  Neck: Normal range of motion.  Cardiovascular: Normal rate, regular rhythm, normal heart sounds and intact distal pulses.  Pulmonary/Chest: Effort normal and breath sounds normal.  Abdominal: Soft. Bowel sounds are normal.  Lymphadenopathy:    She has no cervical adenopathy.  Neurological: She is alert and oriented to person, place, and time.  Skin: Skin is warm and dry. Capillary refill takes less than 2 seconds.  Psychiatric: She has a normal mood and affect. Her behavior is normal.  Nursing note and vitals reviewed.    Musculoskeletal Exam: C-spine, thoracic, and lumbar spine good ROM.  Shoulder joints, elbow joints, and wrist joints good ROM.  MTPs, PIPs, and DIPs good ROM with no synovitis. PIP and DIP synovial thickening.  Hip joints, knee joints, and ankle joints good ROM.  MTPs, PIPs, and DIPs good ROM with no synovitis. 1st MTP synovial thickening.  PIP and DIP synovial thickening. 10 out of 18 tender points.  Trapezius muscle tenderness.   CDAI Exam: No CDAI exam completed.    Investigation: No additional findings. UDS & Narc agreement: 12/31/2016  Imaging: No results found.  Speciality Comments: No specialty comments available.    Procedures:  No procedures performed Allergies: Patient has no known allergies.   Assessment / Plan:     Visit Diagnoses: Primary osteoarthritis of both hands: She has no synovitis on exam.  She has PIP and DIP synovial thickening on exam consistent with osteoarthritis.  She experiences occasional discomfort in bilateral hands.  She was given a handout for hand exercises.  Discussed joint protection and muscle strengthening.  A refill for Voltaren gel was provided.    Primary osteoarthritis of both knees: She has no warmth or  effusion on exam.  Good ROM of bilateral knees.  She can continue to use Voltaren gel as needed.   Primary osteoarthritis of both feet: She has PIP and DIP synovial thickening consistent with osteoarthritis.  She has 1st MTP synovial thickening.  Discussed proper fitting shoes.   Fibromyalgia - She is having generalized pain and 10 out of 18 tender points on exam.  She would like to discontinue Lyrica because she did not experience any benefit.  She is also would like to try decreasing her Cymbalta dose to 30 mg daily.  She was advised to take Cymbalta 60 mg po alternating every other day with Cymbalta 30 mg po daily for 2 weeks or if longer if she cannot tolerate the taper.  A prescription for Cymbalta 30 mg po daily was sent to the pharmacy.  She is going to continue on Ambien 10 mg po at bedtime for insomnia.  She is going to discontinue use of Flexeril.  We discussed trying to decrease her dose of Tramadol, but she would like to remain on Tramadol 100 mg  BID po.  Her UDS and Narcotic agreement were updated today.    Other chronic pain - On tramadol 50 mg 1-2 tablets by mouth twice a day when necessary. She states her pain level is not tolerable without tramadol. UDS& Narc agreement: 12/31/2016, updated today 11/25/17.  Primary insomnia - She takes Ambien 10 mg po at bedtime to help her insomnia.  Good sleep hygiene was discussed.    Other fatigue: Related to her insomnia.    Medication monitoring encounter - UDS was ordered and performed today. Plan: Pain Mgmt, Profile 5 w/Conf, U, Pain Mgmt, Tramadol w/medMATCH, U    Orders: Orders Placed This Encounter  Procedures  . Pain Mgmt, Profile 5 w/Conf, U  . Pain Mgmt, Tramadol w/medMATCH, U   Meds ordered this encounter  Medications  . diclofenac sodium (VOLTAREN) 1 % GEL    Sig: Voltaren Gel 3 grams to 3 large joints upto TID 3 TUBES with 3 refills    Dispense:  5 Tube    Refill:  3    Voltaren Gel 3 grams to 3 large joints upto TID 3 TUBES  with 3 refills  . DULoxetine (CYMBALTA) 30 MG capsule    Sig: Take 1 capsule (30 mg total) by mouth daily.    Dispense:  30 capsule    Refill:  3     Follow-Up Instructions: Return in about 6 months (around 05/25/2018) for FMS.  Bo Merino, MD Note - This record has been created using [employer].  Chart creation errors have been sought, but may not always  have been located. Such creation errors do not reflect on  the standard of medical care.

## 2017-11-22 ENCOUNTER — Other Ambulatory Visit: Payer: Self-pay | Admitting: Rheumatology

## 2017-11-22 NOTE — Telephone Encounter (Signed)
06/04/17 last visit  Next Visit: 11/25/17  Okay to refill per Dr. Estanislado Pandy

## 2017-11-25 ENCOUNTER — Ambulatory Visit (INDEPENDENT_AMBULATORY_CARE_PROVIDER_SITE_OTHER): Payer: 59 | Admitting: Rheumatology

## 2017-11-25 ENCOUNTER — Encounter: Payer: Self-pay | Admitting: Rheumatology

## 2017-11-25 ENCOUNTER — Encounter (INDEPENDENT_AMBULATORY_CARE_PROVIDER_SITE_OTHER): Payer: Self-pay

## 2017-11-25 VITALS — BP 139/78 | HR 76 | Resp 16 | Ht 63.0 in | Wt 131.0 lb

## 2017-11-25 DIAGNOSIS — G8929 Other chronic pain: Secondary | ICD-10-CM

## 2017-11-25 DIAGNOSIS — M19071 Primary osteoarthritis, right ankle and foot: Secondary | ICD-10-CM | POA: Diagnosis not present

## 2017-11-25 DIAGNOSIS — M19041 Primary osteoarthritis, right hand: Secondary | ICD-10-CM | POA: Diagnosis not present

## 2017-11-25 DIAGNOSIS — M19042 Primary osteoarthritis, left hand: Secondary | ICD-10-CM

## 2017-11-25 DIAGNOSIS — Z5181 Encounter for therapeutic drug level monitoring: Secondary | ICD-10-CM

## 2017-11-25 DIAGNOSIS — M19072 Primary osteoarthritis, left ankle and foot: Secondary | ICD-10-CM

## 2017-11-25 DIAGNOSIS — F5101 Primary insomnia: Secondary | ICD-10-CM

## 2017-11-25 DIAGNOSIS — M797 Fibromyalgia: Secondary | ICD-10-CM

## 2017-11-25 DIAGNOSIS — M170 Bilateral primary osteoarthritis of knee: Secondary | ICD-10-CM | POA: Diagnosis not present

## 2017-11-25 DIAGNOSIS — R5383 Other fatigue: Secondary | ICD-10-CM

## 2017-11-25 MED ORDER — DICLOFENAC SODIUM 1 % TD GEL: TRANSDERMAL | 3 refills | Status: DC

## 2017-11-25 MED ORDER — DULOXETINE HCL 30 MG PO CPEP: 30.0000 mg | ORAL_CAPSULE | Freq: Every day | ORAL | 3 refills | Status: DC

## 2017-11-25 NOTE — Patient Instructions (Signed)

## 2017-11-27 LAB — PAIN MGMT, TRAMADOL W/MEDMATCH, U
Desmethyltramadol: 5906 ng/mL — ABNORMAL HIGH (ref <100)
Tramadol: 30793 ng/mL — ABNORMAL HIGH (ref <100)

## 2017-11-27 LAB — PAIN MGMT, PROFILE 5 W/CONF, U
Amphetamines: NEGATIVE ng/mL (ref <500)
BARBITURATES: NEGATIVE ng/mL (ref <300)
Benzodiazepines: NEGATIVE ng/mL (ref <100)
Cocaine Metabolite: NEGATIVE ng/mL (ref <150)
Creatinine: 70.4 mg/dL
MARIJUANA METABOLITE: NEGATIVE ng/mL (ref <20)
METHADONE METABOLITE: NEGATIVE ng/mL (ref <100)
OXYCODONE: NEGATIVE ng/mL (ref <100)
Opiates: NEGATIVE ng/mL (ref <100)
Oxidant: NEGATIVE ug/mL (ref <200)
PH: 6.5 (ref 4.5–9.0)

## 2017-11-27 NOTE — Progress Notes (Signed)
C/w

## 2017-12-02 ENCOUNTER — Other Ambulatory Visit: Payer: Self-pay | Admitting: Rheumatology

## 2017-12-02 NOTE — Telephone Encounter (Signed)
Last Visit: 11/25/17 Next Visit: 06/03/18 UDS: 11/25/17 Narc Agreement: 11/25/17  Okay to refill Tramadol?   

## 2018-01-02 ENCOUNTER — Other Ambulatory Visit: Payer: Self-pay | Admitting: *Deleted

## 2018-01-02 MED ORDER — TRAMADOL HCL 50 MG PO TABS: ORAL_TABLET | ORAL | 0 refills | Status: DC

## 2018-01-02 NOTE — Telephone Encounter (Signed)
Last Visit: 11/25/17 Next Visit: 06/03/18 UDS: 11/25/17 Narc Agreement: 11/25/17  Okay to refill Tramadol?   

## 2018-01-23 ENCOUNTER — Other Ambulatory Visit: Payer: Self-pay | Admitting: Rheumatology

## 2018-01-23 MED ORDER — ZOLPIDEM TARTRATE 10 MG PO TABS: 10.0000 mg | ORAL_TABLET | Freq: Every evening | ORAL | 0 refills | Status: DC | PRN

## 2018-01-23 NOTE — Telephone Encounter (Signed)
Patient called for prescription refill of Ambien.  Patient states that she is completely out of pills.  [facility] [address].

## 2018-01-23 NOTE — Telephone Encounter (Signed)
Ok to refill 

## 2018-01-23 NOTE — Telephone Encounter (Signed)
Last Visit: 11/25/17 Next Visit: 06/03/18  Last Fill 10/21/17  Okay to refill Ambien?

## 2018-01-28 ENCOUNTER — Other Ambulatory Visit: Payer: Self-pay | Admitting: Podiatry

## 2018-01-28 ENCOUNTER — Ambulatory Visit (INDEPENDENT_AMBULATORY_CARE_PROVIDER_SITE_OTHER): Payer: 59

## 2018-01-28 ENCOUNTER — Ambulatory Visit (INDEPENDENT_AMBULATORY_CARE_PROVIDER_SITE_OTHER): Payer: 59 | Admitting: Podiatry

## 2018-01-28 ENCOUNTER — Encounter: Payer: Self-pay | Admitting: Podiatry

## 2018-01-28 VITALS — BP 134/81 | HR 76 | Resp 16

## 2018-01-28 DIAGNOSIS — M7752 Other enthesopathy of left foot: Secondary | ICD-10-CM

## 2018-01-28 DIAGNOSIS — M779 Enthesopathy, unspecified: Secondary | ICD-10-CM

## 2018-01-28 DIAGNOSIS — M778 Other enthesopathies, not elsewhere classified: Secondary | ICD-10-CM

## 2018-01-28 DIAGNOSIS — M205X2 Other deformities of toe(s) (acquired), left foot: Secondary | ICD-10-CM

## 2018-01-28 DIAGNOSIS — S99922A Unspecified injury of left foot, initial encounter: Secondary | ICD-10-CM

## 2018-01-28 NOTE — Progress Notes (Signed)
  Subjective:  Patient ID: Desiree Randall, female    DOB: 02/03/1961,  MRN: [id number] HPI Chief Complaint  Patient presents with  . Foot Pain    1st MPJ left - patient states she was squatting a lot yesterday and when she went to get up to stand she felt a sudden, sharp pain, pain has been constant since, can't wear regular shoes    57 y.o. female presents with the above complaint.     Past Medical History:  Diagnosis Date  . Fibromyalgia   . Osteoarthritis   . Osteopenia    Past Surgical History:  Procedure Laterality Date  . HYSTEROTOMY  2002  . KNEE SURGERY  1990-1991  . OVARY SURGERY  2003   LEFT OVARY REMOVAL  . TUBAL LIGATION  2003   LEFT FALLOPIAN TUBE REMOVED    Current Outpatient Medications:  .  diclofenac sodium (VOLTAREN) 1 % GEL, Voltaren Gel 3 grams to 3 large joints upto TID 3 TUBES with 3 refills, Disp: 5 Tube, Rfl: 3 .  DULoxetine (CYMBALTA) 60 MG capsule, TAKE ONE CAPSULE BY MOUTH DAILY, Disp: 90 capsule, Rfl: 0 .  traMADol (ULTRAM) 50 MG tablet, TAKE 1 OR 2 TABLETS BY MOUTH TWO TIMES A DAY AS NEEDED, Disp: 120 tablet, Rfl: 0 .  zolpidem (AMBIEN) 10 MG tablet, Take 1 tablet (10 mg total) by mouth at bedtime as needed., Disp: 90 tablet, Rfl: 0  No Known Allergies Review of Systems  All other systems reviewed and are negative.  Objective:   Vitals:   01/28/18 1006  BP: 134/81  Pulse: 76  Resp: 16    General: Well developed, nourished, in no acute distress, alert and oriented x3   Dermatological: Skin is warm, dry and supple bilateral. Nails x 10 are well maintained; remaining integument appears unremarkable at this time. There are no open sores, no preulcerative lesions, no rash or signs of infection present.  Vascular: Dorsalis Pedis artery and Posterior Tibial artery pedal pulses are 2/4 bilateral with immedate capillary fill time. Pedal hair growth present. No varicosities and no lower extremity edema present bilateral.   Neruologic:  Grossly intact via light touch bilateral. Vibratory intact via tuning fork bilateral. Protective threshold with Semmes Wienstein monofilament intact to all pedal sites bilateral. Patellar and Achilles deep tendon reflexes 2+ bilateral. No Babinski or clonus noted bilateral.   Musculoskeletal: No gross boney pedal deformities bilateral. No pain, crepitus, or limitation noted with foot and ankle range of motion bilateral. Muscular strength 5/5 in all groups tested bilateral.  Hallux limitus first metatarsophalangeal joint left greater than right.  Exquisite pain on palpation with swelling and edema first metatarsophalangeal joint left greater than right.  Gait: Unassisted, Nonantalgic.    Radiographs:  Radiographs taken today demonstrate 3 views of an osseously mature individual significant osteoarthritis of the first metatarsophalangeal joint is the primary finding dorsal spurring resulting in what appears to be a piece of broken spur in the left metatarsophalangeal joint dorsally.  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   Plan: We discussed etiology pathology conservative versus surgical therapies discussed in great detail today surgical intervention.  She understands this is amenable to it.  In lieu of this we did inject 2 mg of dexamethasone local anesthetic after sterile Betadine skin prep into the joint precisely.  She tolerated procedure well without complications.     Max T. [place], [place]

## 2018-02-03 ENCOUNTER — Telehealth: Payer: Self-pay | Admitting: Rheumatology

## 2018-02-03 MED ORDER — TRAMADOL HCL 50 MG PO TABS: ORAL_TABLET | ORAL | 0 refills | Status: DC

## 2018-02-03 NOTE — Telephone Encounter (Signed)
Last Visit: 11/25/17 Next Visit: 06/03/18 UDS: 11/25/17 Narc Agreement: 11/25/17  Okay to refill Tramadol?   

## 2018-02-03 NOTE — Telephone Encounter (Signed)
Patient called requesting prescription refill of Tramadol.  [facility] is [facility] on [address] [place].

## 2018-02-13 ENCOUNTER — Ambulatory Visit (INDEPENDENT_AMBULATORY_CARE_PROVIDER_SITE_OTHER): Payer: 59 | Admitting: Podiatry

## 2018-02-13 ENCOUNTER — Encounter: Payer: Self-pay | Admitting: Podiatry

## 2018-02-13 DIAGNOSIS — M205X2 Other deformities of toe(s) (acquired), left foot: Secondary | ICD-10-CM

## 2018-02-13 NOTE — Patient Instructions (Signed)
Pre-Operative Instructions  Congratulations, you have decided to take an important step towards improving your quality of life.  You can be assured that the doctors and staff at [facility] will be with you every step of the way.  Here are some important things you should know:  1. Plan to be at the surgery center/hospital at least 1 (one) hour prior to your scheduled time, unless otherwise directed by the surgical center/hospital staff.  You must have a responsible adult accompany you, remain during the surgery and drive you home.  Make sure you have directions to the surgical center/hospital to ensure you arrive on time. 2. If you are having surgery at [facility] or [facility] hospitals, you will need a copy of your medical history and physical form from your family physician within one month prior to the date of surgery. We will give you a form for your primary physician to complete.  3. We make every effort to accommodate the date you request for surgery.  However, there are times where surgery dates or times have to be moved.  We will contact you as soon as possible if a change in schedule is required.   4. No aspirin/ibuprofen for one week before surgery.  If you are on aspirin, any non-steroidal anti-inflammatory medications (Mobic, Aleve, Ibuprofen) should not be taken seven (7) days prior to your surgery.  You make take Tylenol for pain prior to surgery.  5. Medications - If you are taking daily heart and blood pressure medications, seizure, reflux, allergy, asthma, anxiety, pain or diabetes medications, make sure you notify the surgery center/hospital before the day of surgery so they can tell you which medications you should take or avoid the day of surgery. 6. No food or drink after midnight the night before surgery unless directed otherwise by surgical center/hospital staff. 7. No alcoholic beverages 24-hours prior to surgery.  No smoking 24-hours prior or 24-hours after  surgery. 8. Wear loose pants or shorts. They should be loose enough to fit over bandages, boots, and casts. 9. Don't wear slip-on shoes. Sneakers are preferred. 10. Bring your boot with you to the surgery center/hospital.  Also bring crutches or a walker if your physician has prescribed it for you.  If you do not have this equipment, it will be provided for you after surgery. 11. If you have not been contacted by the surgery center/hospital by the day before your surgery, call to confirm the date and time of your surgery. 12. Leave-time from work may vary depending on the type of surgery you have.  Appropriate arrangements should be made prior to surgery with your employer. 13. Prescriptions will be provided immediately following surgery by your doctor.  Fill these as soon as possible after surgery and take the medication as directed. Pain medications will not be refilled on weekends and must be approved by the doctor. 14. Remove nail polish on the operative foot and avoid getting pedicures prior to surgery. 15. Wash the night before surgery.  The night before surgery wash the foot and leg well with water and the antibacterial soap provided. Be sure to pay special attention to beneath the toenails and in between the toes.  Wash for at least three (3) minutes. Rinse thoroughly with water and dry well with a towel.  Perform this wash unless told not to do so by your physician.  Enclosed: 1 Ice pack (please put in freezer the night before surgery)   1 Hibiclens skin cleaner     Pre-op instructions  If you have any questions regarding the instructions, please do not hesitate to call our office.  [place]: [address], [place], [place] [address] -- [phone]  [place]: [address] [place], [place] [address] -- [phone]  [place]: [address]  [place], [place] [address] -- [phone]  [place]: [address], [address], [place],  [address] -- [phone]  Website:  [email or web address] 

## 2018-02-15 NOTE — Progress Notes (Signed)
She presents today for surgical consult regarding her left first metatarsal phalangeal joint.  I spoke with her 2 weeks ago about the necessity of a Keller arthroplasty and a single silicone implant to help alleviate the symptoms.  She has come today to discuss this in greater detail and to schedule surgery as well as sign a consent.  She denies any changes in her medications or allergies or social history.  Objective: Vital signs are stable she is alert and oriented x3 pulses are strongly palpable.  Neurologic sensorium is intact.  Deep tendon reflexes are intact.  She has pain on end range of motion of the first metatarsophalangeal joint left greater than right.  Radiographs were reviewed today demonstrating moderate to severe osteoarthritis and hallux limitus first metatarsophalangeal joint left.  Assessment: Osteoarthritis hallux limitus first metatarsophalangeal joint left over right.  Plan: Discussed etiology pathology conservative versus surgical therapies.  At this point consented her for a Keller arthroplasty with a single silicone implant first metatarsophalangeal joint left foot date of surgery Mar 21, 2018.  We discussed in great detail today options other than a Keller arthroplasty including cheilectomy decompression osteotomies fusions.  We did discuss the possible postop complications associated with this type of surgery which may include but are not limited to postop pain bleeding swelling infection recurrence need for further surgery.  We dispensed paperwork today for her to review regarding the surgery center and anesthesia group also dispensed a Cam walker.  She received preop instructions and I will follow-up with her at the time of surgery.

## 2018-02-26 ENCOUNTER — Other Ambulatory Visit: Payer: Self-pay | Admitting: Rheumatology

## 2018-02-26 NOTE — Telephone Encounter (Signed)
Last Visit: 11/25/17 Next Visit: 06/03/18  Okay to refill per Dr. Deveshwar 

## 2018-03-03 ENCOUNTER — Other Ambulatory Visit: Payer: Self-pay | Admitting: *Deleted

## 2018-03-03 MED ORDER — TRAMADOL HCL 50 MG PO TABS: ORAL_TABLET | ORAL | 0 refills | Status: DC

## 2018-03-03 NOTE — Telephone Encounter (Signed)
Refill request received via fax  Last Visit: 11/25/17 Next Visit: 06/03/18 UDS: 11/25/17 c/w tt Narc Agreement: 11/25/17  Okay to refill tramadol?

## 2018-03-19 HISTORY — PX: OTHER SURGICAL HISTORY: SHX169

## 2018-03-27 ENCOUNTER — Other Ambulatory Visit: Payer: Self-pay | Admitting: Podiatry

## 2018-03-27 MED ORDER — OXYCODONE-ACETAMINOPHEN 10-325 MG PO TABS: 1.0000 | ORAL_TABLET | Freq: Four times a day (QID) | ORAL | 0 refills | Status: AC | PRN

## 2018-03-27 MED ORDER — CEPHALEXIN 500 MG PO CAPS: 500.0000 mg | ORAL_CAPSULE | Freq: Three times a day (TID) | ORAL | 0 refills | Status: DC

## 2018-03-27 MED ORDER — ONDANSETRON HCL 4 MG PO TABS: 4.0000 mg | ORAL_TABLET | Freq: Three times a day (TID) | ORAL | 0 refills | Status: DC | PRN

## 2018-03-28 ENCOUNTER — Encounter: Payer: Self-pay | Admitting: Podiatry

## 2018-03-28 DIAGNOSIS — M2022 Hallux rigidus, left foot: Secondary | ICD-10-CM | POA: Diagnosis not present

## 2018-03-31 ENCOUNTER — Other Ambulatory Visit: Payer: Self-pay | Admitting: Physician Assistant

## 2018-03-31 NOTE — Telephone Encounter (Signed)
Last Visit: 11/25/17 Next Visit: 06/03/18 UDS: 11/25/17 c/w tt Narc Agreement: 11/25/17  Okay to refill tramadol?

## 2018-04-03 ENCOUNTER — Ambulatory Visit (INDEPENDENT_AMBULATORY_CARE_PROVIDER_SITE_OTHER): Payer: Self-pay | Admitting: Podiatry

## 2018-04-03 ENCOUNTER — Ambulatory Visit: Payer: 59

## 2018-04-03 ENCOUNTER — Ambulatory Visit (INDEPENDENT_AMBULATORY_CARE_PROVIDER_SITE_OTHER): Payer: 59

## 2018-04-03 VITALS — BP 133/88 | HR 78 | Temp 98.4°F

## 2018-04-03 DIAGNOSIS — M779 Enthesopathy, unspecified: Secondary | ICD-10-CM

## 2018-04-03 DIAGNOSIS — M205X1 Other deformities of toe(s) (acquired), right foot: Secondary | ICD-10-CM | POA: Diagnosis not present

## 2018-04-03 DIAGNOSIS — M205X2 Other deformities of toe(s) (acquired), left foot: Secondary | ICD-10-CM | POA: Diagnosis not present

## 2018-04-03 DIAGNOSIS — M778 Other enthesopathies, not elsewhere classified: Secondary | ICD-10-CM

## 2018-04-08 ENCOUNTER — Telehealth: Payer: Self-pay | Admitting: Podiatry

## 2018-04-08 NOTE — Telephone Encounter (Signed)
Pt presented to office for exchange of cam boot. I refitted pt with the tall small cam boot.

## 2018-04-08 NOTE — Telephone Encounter (Signed)
I informed pt we would exchange the boot, and she states she had only had it 1 week.

## 2018-04-08 NOTE — Telephone Encounter (Signed)
I'm a pt of Dr. Stephenie Acres and I had surgery on 10 May and I'm scheduled to have surgery on my other foot this Friday. I'm calling because one of the straps attached to the buckle on the boot has broke. We have tried everything to fix it, so I was wondering if I could exchange this boot for one that is not defected since I will have to wear it for several weeks. My telephone number is [phone]. Thank you. Bye bye.

## 2018-04-09 ENCOUNTER — Other Ambulatory Visit: Payer: Self-pay | Admitting: Podiatry

## 2018-04-09 MED ORDER — HYDROMORPHONE HCL 4 MG PO TABS: 4.0000 mg | ORAL_TABLET | Freq: Four times a day (QID) | ORAL | 0 refills | Status: AC | PRN

## 2018-04-09 MED ORDER — CEPHALEXIN 500 MG PO CAPS: 500.0000 mg | ORAL_CAPSULE | Freq: Three times a day (TID) | ORAL | 0 refills | Status: DC

## 2018-04-09 MED ORDER — ONDANSETRON HCL 4 MG PO TABS: 4.0000 mg | ORAL_TABLET | Freq: Three times a day (TID) | ORAL | 0 refills | Status: DC | PRN

## 2018-04-10 ENCOUNTER — Ambulatory Visit (INDEPENDENT_AMBULATORY_CARE_PROVIDER_SITE_OTHER): Payer: 59 | Admitting: Podiatry

## 2018-04-10 ENCOUNTER — Encounter: Payer: Self-pay | Admitting: Podiatry

## 2018-04-10 DIAGNOSIS — M205X2 Other deformities of toe(s) (acquired), left foot: Secondary | ICD-10-CM

## 2018-04-10 NOTE — Progress Notes (Signed)
She presents today for her second postop visit date of surgery is 03/28/2018 status post [name] bunion repair with implant left foot.  She states that she is having some pains particularly at nighttime that wake her up but all in all seems to be doing pretty well she says she is been trying to remove it and seems to be getting better.  She is scheduled for surgery on the contralateral foot or the right foot tomorrow morning.  Objective: Vital signs are stable she is alert and oriented x3.  Minimal edema no erythema cellulitis drainage or odor sutures are intact margins well coapted she has great range of motion of the first metatarsophalangeal joint of the left foot.  Assessment: Well-healing surgical foot left.  Plan: I will let her start getting this wet and put a compression anklet in a Darco shoe and we will switch shoes tomorrow I will follow-up with her in 1 week for evaluation of the contralateral foot.

## 2018-04-11 DIAGNOSIS — M2011 Hallux valgus (acquired), right foot: Secondary | ICD-10-CM | POA: Diagnosis not present

## 2018-04-17 ENCOUNTER — Ambulatory Visit (INDEPENDENT_AMBULATORY_CARE_PROVIDER_SITE_OTHER): Payer: 59

## 2018-04-17 ENCOUNTER — Ambulatory Visit (INDEPENDENT_AMBULATORY_CARE_PROVIDER_SITE_OTHER): Payer: Self-pay | Admitting: Podiatry

## 2018-04-17 VITALS — BP 138/87 | HR 78

## 2018-04-17 DIAGNOSIS — M205X2 Other deformities of toe(s) (acquired), left foot: Secondary | ICD-10-CM | POA: Diagnosis not present

## 2018-04-17 DIAGNOSIS — M205X1 Other deformities of toe(s) (acquired), right foot: Secondary | ICD-10-CM

## 2018-04-17 NOTE — Progress Notes (Signed)
She presents today 1 week status post Keller arthroplasty single silicone implant right foot.  Date of surgery 04/11/2018 her left foot was done 2 weeks before that.  She states that the right one was a little more tender than the left one but all in all she is doing much better.  Objective: Vital signs are stable she is alert and oriented x3 dry sterile dressing was removed from the right foot demonstrates mild edema no erythema cellulitis drainage or odor great range of motion margins are well coapted.  Passive and active range of motion is full and equal bilateral.  Radiographs taken today of the right foot demonstrate a Keller arthroplasty with a single silicone implant and grommets intact and in good position.  Assessment: Well-healing surgical foot bilateral.  Plan: Redressed the right foot today with a dry sterile compressive dressing right.  Left foot anklet is intact and she will use that her left foot with a Darco shoe.

## 2018-04-21 ENCOUNTER — Other Ambulatory Visit: Payer: Self-pay | Admitting: Physician Assistant

## 2018-04-21 NOTE — Telephone Encounter (Signed)
Last Visit: 11/25/17 Next Visit: 06/03/18  Okay to refill Ambien?

## 2018-04-24 ENCOUNTER — Ambulatory Visit (INDEPENDENT_AMBULATORY_CARE_PROVIDER_SITE_OTHER): Payer: 59 | Admitting: Podiatry

## 2018-04-24 DIAGNOSIS — M205X2 Other deformities of toe(s) (acquired), left foot: Secondary | ICD-10-CM

## 2018-04-24 DIAGNOSIS — M205X1 Other deformities of toe(s) (acquired), right foot: Secondary | ICD-10-CM

## 2018-04-24 NOTE — Progress Notes (Signed)
She presents today status post Keller arthroplasty with a single silicone implant bilateral foot.  Left foot is out 1 month now and the second foot is out on 2 weeks now.  She denies fever chills nausea vomiting states that she is doing quite well she is in a tennis shoe for the first time on the left foot.  Objective: Vital signs are stable alert and oriented x3 there is no erythema cellulitis drainage or odor mild edema to the left foot but minimal to the right foot.  She has great range of motion but some tenderness on range of motion of the first metatarsophalangeal joint right foot.  Assessment: Well-healing surgical foot bilateral.  Plan: Redressed today with a compression anklet and put her put her in a Darco shoe right and regular shoe left I will follow-up with her in 2 weeks and she should be back into regular shoe gear.

## 2018-04-30 ENCOUNTER — Other Ambulatory Visit: Payer: Self-pay | Admitting: Physician Assistant

## 2018-04-30 NOTE — Telephone Encounter (Signed)
Last Visit: 11/25/17 Next Visit: 06/03/18 UDS: 11/25/17 Narc Agreement: 11/25/17  Okay to refill Tramadol?   

## 2018-04-30 NOTE — Telephone Encounter (Signed)
ok 

## 2018-05-08 ENCOUNTER — Encounter: Payer: 59 | Admitting: Podiatry

## 2018-05-13 ENCOUNTER — Ambulatory Visit (INDEPENDENT_AMBULATORY_CARE_PROVIDER_SITE_OTHER): Payer: 59 | Admitting: Podiatry

## 2018-05-13 ENCOUNTER — Ambulatory Visit (INDEPENDENT_AMBULATORY_CARE_PROVIDER_SITE_OTHER): Payer: 59

## 2018-05-13 ENCOUNTER — Encounter: Payer: Self-pay | Admitting: Podiatry

## 2018-05-13 DIAGNOSIS — M205X1 Other deformities of toe(s) (acquired), right foot: Secondary | ICD-10-CM

## 2018-05-13 DIAGNOSIS — M205X2 Other deformities of toe(s) (acquired), left foot: Secondary | ICD-10-CM

## 2018-05-13 DIAGNOSIS — Z9889 Other specified postprocedural states: Secondary | ICD-10-CM

## 2018-05-14 NOTE — Progress Notes (Signed)
She presents today for follow-up of her bilateral Keller arthroplasty to a single silicone implants.  Date of surgery 5/24/2019The right 03/28/2018 for the left.  She states they are both doing great.  She denies fever chills nausea vomiting muscle aches pains.  Objective: Vital signs are stable she is alert and oriented x3.  Pulses are palpable.  Neurologic sensorium is intact she is great range of motion first metatarsophalangeal joints bilateral no erythematous mild edema no cellulitis drainage or odor.  Radiographs taken today demonstrate well healing Keller arthroplasty single silicone implant.  Assessment: Well-healing surgical foot.  Plan: Discussed etiology pathology and surgical therapies follow-up with me on as-needed basis.  Continue massage therapy to help break up scar tissue and her daughter will assist with this.

## 2018-05-29 ENCOUNTER — Other Ambulatory Visit: Payer: Self-pay | Admitting: Rheumatology

## 2018-05-29 NOTE — Telephone Encounter (Signed)
Last Visit: 11/25/17 Next Visit: 06/03/18  Okay to refill per Dr. Estanislado Pandy

## 2018-06-02 ENCOUNTER — Other Ambulatory Visit: Payer: Self-pay | Admitting: Rheumatology

## 2018-06-02 NOTE — Telephone Encounter (Signed)
Last Visit: 11/25/17 Next Visit: 06/03/18 UDS: 11/25/17 Narc Agreement: 11/25/17  Okay to refill Tramadol?

## 2018-06-02 NOTE — Telephone Encounter (Signed)
ok 

## 2018-06-03 ENCOUNTER — Ambulatory Visit: Payer: 59 | Admitting: Rheumatology

## 2018-06-03 NOTE — Progress Notes (Signed)
Office Visit Note  Patient: Desiree Randall             Date of Birth: Nov 18, 1961           MRN: [id number]             PCP: Darcus Austin, MD Referring: Darcus Austin, MD Visit Date: 06/17/2018 Occupation: @GUAROCC @  Subjective:  Muscle tenderness   History of Present Illness: Desiree Randall is a 57 y.o. female with history of osteoarthritis and fibromyalgia.  Patient is on Cymbalta 60 mg by mouth daily and takes Ambien 10 mg by mouth at bedtime for insomnia.  She takes tramadol 50 mg 1 to 2 tablets by mouth twice daily for pain relief.  Patient states that she had bilateral first MTP joint replacements in June 2019 performed by Dr. Milinda Pointer.  She states that she is recovering well without any complications.  She states that she is fully bearing weight and is able to wear tennis shoes.  She is able to walk 45 minutes daily without any pain in her feet.  She states that she has been less active due to her recent surgery which has been causing a fibromyalgia flare.  She states that her fibromyalgia pain has been severe.  Her muscle aches and muscle tenderness are more severe in her lower extremities.  She has trochanteric bursitis bilaterally.  She states that she has been having to take tramadol 2 tablets by mouth twice daily.  She also uses Voltaren gel.  She states she continues to have chronic insomnia and fatigue.  She states she is having increased pain at night and has been taking Ambien 10 mg at bedtime.  She states she continues to have discomfort in bilateral hands and notices some joint swelling.  She states that she is stiff all day long.  She states she tried taking Tumeric and tart cherry denies any benefit.  She continues take omega-3 which she has noticed some benefit from.  She reports that she cannot take NSAIDs due to history of peptic ulcer disease.  She states in the past she tried to go to integrative therapies for several sessions for myofascial release but could not afford the  co-pay.  Her insurance would not cover negative therapies.  She states that she can try dry needling soon.  She states she is also following up with her orthopedist for Visco gel injections of bilateral knee joints.    Activities of Daily Living:  Patient reports morning stiffness   all day.   Patient Reports nocturnal pain.  Difficulty dressing/grooming: Denies Difficulty climbing stairs: Reports Difficulty getting out of chair: Denies Difficulty using hands for taps, buttons, cutlery, and/or writing: Reports  Review of Systems  Constitutional: Positive for fatigue.  HENT: Negative for mouth sores, mouth dryness and nose dryness.   Eyes: Negative for pain, visual disturbance and dryness.  Respiratory: Negative for cough, hemoptysis, shortness of breath and difficulty breathing.   Cardiovascular: Negative for chest pain, palpitations, hypertension and swelling in legs/feet.  Gastrointestinal: Negative for blood in stool, constipation and diarrhea.  Endocrine: Negative for increased urination.  Genitourinary: Negative for painful urination.  Musculoskeletal: Positive for arthralgias, joint pain, myalgias, morning stiffness, muscle tenderness and myalgias. Negative for joint swelling and muscle weakness.  Skin: Negative for color change, pallor, rash, hair loss, nodules/bumps, skin tightness, ulcers and sensitivity to sunlight.  Allergic/Immunologic: Negative for susceptible to infections.  Neurological: Negative for dizziness, numbness, headaches and weakness.  Hematological: Negative for  swollen glands.  Psychiatric/Behavioral: Positive for sleep disturbance. Negative for depressed mood. The patient is not nervous/anxious.     PMFS History:  Patient Active Problem List   Diagnosis Date Noted  . Fibromyalgia 12/27/2016  . Other fatigue 12/27/2016  . Primary insomnia 12/27/2016  . Primary osteoarthritis of both hands 12/27/2016  . Primary osteoarthritis of both knees 12/27/2016      Past Medical History:  Diagnosis Date  . Fibromyalgia   . Osteoarthritis   . Osteopenia     Family History  Problem Relation Age of Onset  . Melanoma Mother   . Heart disease Mother   . Hypertension Mother   . Heart attack Mother   . Heart disease Father        TRIPLE BYPASS  . Hypercholesterolemia Father   . Heart attack Maternal Grandfather   . Stroke Maternal Grandfather   . Aneurysm Maternal Grandfather   . Heart failure Maternal Grandmother   . Heart attack Paternal Grandmother   . Heart disease Paternal Grandfather   . Kidney Stones Brother    Past Surgical History:  Procedure Laterality Date  . HYSTEROTOMY  2002  . KNEE SURGERY  1990-1991  . OVARY SURGERY  2003   LEFT OVARY REMOVAL  . toe joint replacement Bilateral 03/2018  . TUBAL LIGATION  2003   LEFT FALLOPIAN TUBE REMOVED   Social History   Social History Narrative  . Not on file    Objective: Vital Signs: BP 135/85 (BP Location: Left Arm, Patient Position: Sitting, Cuff Size: Normal)   Pulse 94   Resp 13   Ht 5\' 3"  (1.6 m)   Wt 131 lb (59.4 kg)   BMI 23.21 kg/m    Physical Exam  Constitutional: She is oriented to person, place, and time. She appears well-developed and well-nourished.  HENT:  Head: Normocephalic and atraumatic.  Eyes: Conjunctivae and EOM are normal.  Neck: Normal range of motion.  Cardiovascular: Normal rate, regular rhythm, normal heart sounds and intact distal pulses.  Pulmonary/Chest: Effort normal and breath sounds normal.  Abdominal: Soft. Bowel sounds are normal.  Lymphadenopathy:    She has no cervical adenopathy.  Neurological: She is alert and oriented to person, place, and time.  Skin: Skin is warm and dry. Capillary refill takes less than 2 seconds.  Psychiatric: She has a normal mood and affect. Her behavior is normal.  Nursing note and vitals reviewed.    Musculoskeletal Exam: Generalized hyperalgesia. C-spine, thoracic spine, lumbar spine good range of  motion.  No midline spinal tenderness.  No SI joint tenderness.  Shoulder joints, elbow joints, wrist joints, MCPs, PIPs, DIPs good range of motion no synovitis.  Hip joints, knee joints, ankle joints, MTPs, PIPs, DIPs good range of motion no synovitis.  Well-healed incision of bilateral first MTP replacements.  No warmth or effusion of bilateral knee joints.  She has bilateral knee crepitus.  She has tenderness of bilateral trochanteric bursa. She has muscle tenderness in bilateral lower extremities.   CDAI Exam: No CDAI exam completed.   Investigation: No additional findings.  Imaging: No results found.  Recent Labs: No results found for: WBC, HGB, PLT, NA, K, CL, CO2, GLUCOSE, BUN, CREATININE, BILITOT, ALKPHOS, AST, ALT, PROT, ALBUMIN, CALCIUM, GFRAA, QFTBGOLD, QFTBGOLDPLUS  Speciality Comments: No specialty comments available.  Procedures:  No procedures performed Allergies: Patient has no known allergies.   Assessment / Plan:     Visit Diagnoses: Primary osteoarthritis of both hands: She has PIP and DIP  synovial thickening consistent with osteoarthritis of both hands. She has no synovitis on exam.  She has been having increased discomfort in bilateral hands. Joint protection and muscle strengthening were discussed.    Primary osteoarthritis of both knees: No warmth or effusion of knee joints.  Bilateral knee crepitus.  She is planning to have bilateral visco gel injections performed by orthopedist.   Primary osteoarthritis of both feet: She has PIP and DIP synovial thickening consistent with osteoarthritis of both feet. She had bilateral 1st MTP joint replacements in May 2019 two weeks apart performed by Dr. Milinda Pointer.  She continues to notice improvement.  She states she is able to walk 45 minutes pain free. Well healed incision sites. She has no tenderness on exam today.  She was more sedentary while recovering, which has led to a fibromyalgia flare. She is following up with Dr. Milinda Pointer  PRN.   Fibromyalgia -she continues to have generalized muscle aches muscle tenderness due to fibromyalgia.  She has generalized hyperalgesia on exam.  She is having a viral flares since the beginning of June.  She had bilateral first MTP joint replacements at the end of May 2019.  She has been more sedentary while recovering which is led to increase her myalgia pain.  She has trochanteric bursitis bilaterally.  She has not been performing her exercises on a regular basis.  She plans on restarting her stretching and going to yoga again.  She also is going to try dry needling.  She continues to take Cymbalta 60 mg by mouth daily.  She reports that she had increased pain when she tried days about 30 mg by mouth daily.  She continues to take tramadol 2 tablets by mouth twice daily for pain relief.  She has chronic insomnia and fatigue.  She has been taking Ambien 10 mg at bedtime.  She previously had an inadequate response to Lyrica.   Other chronic pain - Tramadol 50 mg 1-2 tablets by mouth twice a day PRN. UDS and narcotic agreement 11/25/17  Primary insomnia: She takes Ambien 10 mg at bedtime.  Refill sent to the pharmacy today.  The prescription will not be refilled until 06/25/18.   Other fatigue: Chronic and worsening.   Orders: No orders of the defined types were placed in this encounter.  Meds ordered this encounter  Medications  . diclofenac sodium (VOLTAREN) 1 % GEL    Sig: Apply 3 g to 3 large joints up to 3 times daily    Dispense:  3 Tube    Refill:  3  . zolpidem (AMBIEN) 10 MG tablet    Sig: Take 1 tablet (10 mg total) by mouth at bedtime as needed.    Dispense:  90 tablet    Refill:  0    Do not refill before 7/719    Face-to-face time spent with patient was 30 minutes. Greater than 50% of time was spent in counseling and coordination of care.  Follow-Up Instructions: Return in about 6 months (around 12/18/2018) for Osteoarthritis, Fibromyalgia.   Ofilia Neas, PA-C  Note -  This record has been created using Dragon software.  Chart creation errors have been sought, but may not always  have been located. Such creation errors do not reflect on  the standard of medical care.

## 2018-06-08 NOTE — Progress Notes (Signed)
  Subjective:  Patient ID: Desiree Randall, female    DOB: Sep 08, 1961,  MRN: [id number]  Chief Complaint  Patient presents with  . Routine Post [facility] 05.10.2019 Desiree Randall Implant Lt     DOS: 03/28/18 Procedure: [name] with implant left  57 y.o. female returns for post-op check. Denies N/V/F/Ch. Pain is controlled with current medications.  Objective:   General AA&O x3. Normal mood and affect.  Vascular Foot warm and well perfused.  Neurologic Gross sensation intact.  Dermatologic Skin healing well without signs of infection. Skin edges well coapted without signs of infection.  Orthopedic: Tenderness to palpation noted about the surgical site.    Assessment & Plan:  Patient was evaluated and treated and all questions answered.  S/p [name] bunionectomy with implant -Progressing as expected post-operatively. -Sutures: intact. -Medications refilled: none -Foot redressed.  X-rays taken of right foot for surgical planning. No follow-ups on file.

## 2018-06-17 ENCOUNTER — Ambulatory Visit (INDEPENDENT_AMBULATORY_CARE_PROVIDER_SITE_OTHER): Payer: 59 | Admitting: Physician Assistant

## 2018-06-17 ENCOUNTER — Encounter: Payer: Self-pay | Admitting: Physician Assistant

## 2018-06-17 ENCOUNTER — Encounter (INDEPENDENT_AMBULATORY_CARE_PROVIDER_SITE_OTHER): Payer: Self-pay

## 2018-06-17 VITALS — BP 135/85 | HR 94 | Resp 13 | Ht 63.0 in | Wt 131.0 lb

## 2018-06-17 DIAGNOSIS — M19071 Primary osteoarthritis, right ankle and foot: Secondary | ICD-10-CM | POA: Diagnosis not present

## 2018-06-17 DIAGNOSIS — M19042 Primary osteoarthritis, left hand: Secondary | ICD-10-CM

## 2018-06-17 DIAGNOSIS — M19072 Primary osteoarthritis, left ankle and foot: Secondary | ICD-10-CM

## 2018-06-17 DIAGNOSIS — R5383 Other fatigue: Secondary | ICD-10-CM

## 2018-06-17 DIAGNOSIS — G8929 Other chronic pain: Secondary | ICD-10-CM

## 2018-06-17 DIAGNOSIS — M19041 Primary osteoarthritis, right hand: Secondary | ICD-10-CM | POA: Diagnosis not present

## 2018-06-17 DIAGNOSIS — F5101 Primary insomnia: Secondary | ICD-10-CM

## 2018-06-17 DIAGNOSIS — M170 Bilateral primary osteoarthritis of knee: Secondary | ICD-10-CM | POA: Diagnosis not present

## 2018-06-17 DIAGNOSIS — M797 Fibromyalgia: Secondary | ICD-10-CM

## 2018-06-17 MED ORDER — DICLOFENAC SODIUM 1 % TD GEL: TRANSDERMAL | 3 refills | Status: AC

## 2018-06-17 MED ORDER — ZOLPIDEM TARTRATE 10 MG PO TABS: 10.0000 mg | ORAL_TABLET | Freq: Every evening | ORAL | 0 refills | Status: DC | PRN

## 2018-07-02 ENCOUNTER — Other Ambulatory Visit: Payer: Self-pay

## 2018-07-02 ENCOUNTER — Other Ambulatory Visit: Payer: Self-pay | Admitting: Rheumatology

## 2018-07-02 DIAGNOSIS — Z5181 Encounter for therapeutic drug level monitoring: Secondary | ICD-10-CM

## 2018-07-02 DIAGNOSIS — G8929 Other chronic pain: Secondary | ICD-10-CM

## 2018-07-02 NOTE — Telephone Encounter (Addendum)
Last visit: 06/17/2018 Next visit: 12/23/2018 UDS: 07/02/2018 (pending) Narc agreement: 07/02/2018  Last fill: 06/02/2018  Okay to refill tramadol?

## 2018-07-04 NOTE — Progress Notes (Signed)
C/w

## 2018-07-05 LAB — PAIN MGMT, TRAMADOL W/MEDMATCH, U
Desmethyltramadol: 6391 ng/mL — ABNORMAL HIGH (ref <100)
Tramadol: 32227 ng/mL — ABNORMAL HIGH (ref <100)

## 2018-07-05 LAB — PAIN MGMT, PROFILE 5 W/CONF, U
AMPHETAMINES: NEGATIVE ng/mL (ref <500)
Barbiturates: NEGATIVE ng/mL (ref <300)
Benzodiazepines: NEGATIVE ng/mL (ref <100)
COCAINE METABOLITE: NEGATIVE ng/mL (ref <150)
CREATININE: 102.2 mg/dL
Marijuana Metabolite: NEGATIVE ng/mL (ref <20)
Methadone Metabolite: NEGATIVE ng/mL (ref <100)
OPIATES: NEGATIVE ng/mL (ref <100)
Oxidant: NEGATIVE ug/mL (ref <200)
Oxycodone: NEGATIVE ng/mL (ref <100)
pH: 6.99 (ref 4.5–9.0)

## 2018-08-04 ENCOUNTER — Telehealth: Payer: Self-pay | Admitting: Rheumatology

## 2018-08-04 ENCOUNTER — Other Ambulatory Visit: Payer: Self-pay | Admitting: Physician Assistant

## 2018-08-04 NOTE — Telephone Encounter (Signed)
Patient left a voicemail requesting prescription refill of Tramadol to be sent to [facility] in [place].  Patient states she is out of medication.

## 2018-08-04 NOTE — Telephone Encounter (Signed)
prescription has been sent to the pharmacy today, patient is aware.

## 2018-08-04 NOTE — Telephone Encounter (Signed)
Last visit: 06/17/2018 Next visit: 12/23/2018 UDS: 07/02/2018  Narc agreement: 07/02/2018  Okay to refill Tramadol?

## 2018-08-26 ENCOUNTER — Other Ambulatory Visit: Payer: Self-pay | Admitting: Rheumatology

## 2018-08-26 NOTE — Telephone Encounter (Signed)
Last visit: 06/17/2018 Next visit: 12/23/2018  Okay to refill per Dr. Deveshwar 

## 2018-09-01 ENCOUNTER — Other Ambulatory Visit: Payer: Self-pay | Admitting: Physician Assistant

## 2018-09-01 NOTE — Telephone Encounter (Signed)
Last visit: 06/17/2018 Next visit: 12/23/2018 UDS:07/02/2018  Narc agreement:07/02/2018  Last Fill: 08/04/18  Okay to refill Tramadol?

## 2018-10-02 ENCOUNTER — Other Ambulatory Visit: Payer: Self-pay | Admitting: Physician Assistant

## 2018-10-02 NOTE — Telephone Encounter (Signed)
Last visit: 06/17/2018 Next visit: 12/23/2018 UDS:07/02/2018  Narc agreement:07/02/2018  Last Fill: 09/01/18  Okay to refill Tramadol?

## 2018-10-22 ENCOUNTER — Telehealth: Payer: Self-pay | Admitting: Rheumatology

## 2018-10-22 ENCOUNTER — Other Ambulatory Visit: Payer: Self-pay | Admitting: *Deleted

## 2018-10-22 MED ORDER — ZOLPIDEM TARTRATE 10 MG PO TABS: 10.0000 mg | ORAL_TABLET | Freq: Every evening | ORAL | 0 refills | Status: DC | PRN

## 2018-10-22 NOTE — Telephone Encounter (Signed)
Patient left a voicemail requesting prescription refill of Ambien to be sent to [facility] on [address].  Patient states that the pharmacy tried to fax over the refill request, but they are having computer issues and not sure our office received the fax.  Patient is requesting a 90 day supply.

## 2018-10-22 NOTE — Telephone Encounter (Signed)
Refill request received via fax  Last visit: 06/17/2018 Next visit: 12/23/2018  Okay to refill Ambien?

## 2018-10-23 NOTE — Telephone Encounter (Signed)
Prescription was faxed to the pharmacy 10/22/18.

## 2018-11-03 ENCOUNTER — Other Ambulatory Visit: Payer: Self-pay | Admitting: Physician Assistant

## 2018-11-03 NOTE — Telephone Encounter (Signed)
Last visit: 06/17/2018 Next visit: 12/23/2018 UDS:07/02/2018  Narc agreement:07/02/2018  Last Fill: 10/02/18  Okay to refill Tramadol?

## 2018-11-22 ENCOUNTER — Other Ambulatory Visit: Payer: Self-pay | Admitting: Rheumatology

## 2018-11-24 NOTE — Telephone Encounter (Signed)
Last visit: 06/17/2018 Next visit: 12/23/2018  Okay to refill per Dr. Estanislado Pandy

## 2018-12-03 ENCOUNTER — Other Ambulatory Visit: Payer: Self-pay | Admitting: Physician Assistant

## 2018-12-03 NOTE — Telephone Encounter (Signed)
Last visit: 06/17/2018 Next visit: 12/23/2018 UDS:07/02/2018  Narc agreement:07/02/2018  Last Fill:11/03/18  Okay to refill Tramadol?

## 2018-12-08 ENCOUNTER — Telehealth: Payer: Self-pay | Admitting: *Deleted

## 2018-12-08 NOTE — Telephone Encounter (Signed)
Prior Authorization submitted via cover my meds for Tramadol. Will update once response received.

## 2018-12-09 NOTE — Progress Notes (Deleted)
Office Visit Note  Patient: Desiree Randall             Date of Birth: [birth year]           MRN: [id number]             PCP: Darcus Austin, MD (Inactive) Referring: Darcus Austin, MD Visit Date: 12/23/2018 Occupation: @GUAROCC @  Subjective:  No chief complaint on file.   History of Present Illness: Desiree Randall is a 58 y.o. female ***   Activities of Daily Living:  Patient reports morning stiffness for *** {minute/hour:19697}.   Patient {ACTIONS;DENIES/REPORTS:21021675::"Denies"} nocturnal pain.  Difficulty dressing/grooming: {ACTIONS;DENIES/REPORTS:21021675::"Denies"} Difficulty climbing stairs: {ACTIONS;DENIES/REPORTS:21021675::"Denies"} Difficulty getting out of chair: {ACTIONS;DENIES/REPORTS:21021675::"Denies"} Difficulty using hands for taps, buttons, cutlery, and/or writing: {ACTIONS;DENIES/REPORTS:21021675::"Denies"}  No Rheumatology ROS completed.   PMFS History:  Patient Active Problem List   Diagnosis Date Noted  . Fibromyalgia 12/27/2016  . Other fatigue 12/27/2016  . Primary insomnia 12/27/2016  . Primary osteoarthritis of both hands 12/27/2016  . Primary osteoarthritis of both knees 12/27/2016    Past Medical History:  Diagnosis Date  . Fibromyalgia   . Osteoarthritis   . Osteopenia     Family History  Problem Relation Age of Onset  . Melanoma Mother   . Heart disease Mother   . Hypertension Mother   . Heart attack Mother   . Heart disease Father        TRIPLE BYPASS  . Hypercholesterolemia Father   . Heart attack Maternal Grandfather   . Stroke Maternal Grandfather   . Aneurysm Maternal Grandfather   . Heart failure Maternal Grandmother   . Heart attack Paternal Grandmother   . Heart disease Paternal Grandfather   . Kidney Stones Brother    Past Surgical History:  Procedure Laterality Date  . HYSTEROTOMY  2002  . KNEE SURGERY  1990-1991  . OVARY SURGERY  2003   LEFT OVARY REMOVAL  . toe joint replacement Bilateral 03/2018  .  TUBAL LIGATION  2003   LEFT FALLOPIAN TUBE REMOVED   Social History   Social History Narrative  . Not on file    There is no immunization history on file for this patient.   Objective: Vital Signs: There were no vitals taken for this visit.   Physical Exam   Musculoskeletal Exam: ***  CDAI Exam: CDAI Score: Not documented Patient Global Assessment: Not documented; Provider Global Assessment: Not documented Swollen: Not documented; Tender: Not documented Joint Exam   Not documented   There is currently no information documented on the homunculus. Go to the Rheumatology activity and complete the homunculus joint exam.  Investigation: No additional findings.  Imaging: No results found.  Recent Labs: No results found for: WBC, HGB, PLT, NA, K, CL, CO2, GLUCOSE, BUN, CREATININE, BILITOT, ALKPHOS, AST, ALT, PROT, ALBUMIN, CALCIUM, GFRAA, QFTBGOLD, QFTBGOLDPLUS  Speciality Comments: No specialty comments available.  Procedures:  No procedures performed Allergies: Patient has no known allergies.   Assessment / Plan:     Visit Diagnoses: Primary osteoarthritis of both hands  Primary osteoarthritis of both knees  Primary osteoarthritis of both feet - bilateral 1st MTP joint replacements in May 2019 two weeks apart performed by Dr. Milinda Pointer.   Fibromyalgia - Cymbalta 60 mg by mouth daily  Primary insomnia - Ambien 10 mg at bedtime  Other fatigue  Medication monitoring encounter - Tramadol 50 mg 1-2 tablets by mouth twice a day PRN   Orders: No orders of the defined types were placed in this  encounter.  No orders of the defined types were placed in this encounter.   Face-to-face time spent with patient was *** minutes. Greater than 50% of time was spent in counseling and coordination of care.  Follow-Up Instructions: No follow-ups on file.   Ofilia Neas, PA-C  Note - This record has been created using Dragon software.  Chart creation errors have been sought,  but may not always  have been located. Such creation errors do not reflect on  the standard of medical care.

## 2018-12-23 ENCOUNTER — Ambulatory Visit: Payer: 59 | Admitting: Physician Assistant

## 2018-12-29 NOTE — Progress Notes (Signed)
Office Visit Note  Patient: Desiree Randall             Date of Birth: Feb 26, 1961           MRN: [id number]             PCP: Darcus Austin, MD (Inactive) Referring: Darcus Austin, MD Visit Date: 01/01/2019 Occupation: @GUAROCC @  Subjective:  Pain in both hands   History of Present Illness: DARREL BARONI is a 58 y.o. female with history of osteoarthritis and fibromyalgia.  She takes tramadol 50 mg 1 to 2 tablets by mouth twice daily PRN for pain relief and applies voltaren gel topically PRN.  She continues to take Cymbalta 60 mg by mouth daily.  She takes Ambien 10 mg 1 tablet by mouth at bedtime to help with insomnia.  She continues have generalized muscle aches and muscle tenderness due to fibromyalgia.  Over the past 1 month she has been having increased myalgias.  She states that overall her fatigue has improved.  She has been walking for exercise.  She states that most nights she sleeps about 8 hours after taking Ambien.  She reports that she is currently having pain in bilateral hands.  She states that she has been having increased discomfort when she is writing and has been dropping objects more frequently.  She states she has occasional bilateral knee pain but denies any joint swelling.  She states she is having bilateral lateral epicondylitis.  She reports that she is been having some increased stiffness in the left first MTP joint that was replaced by Dr. Milinda Pointer.  She uses voltaren gel topically.   Activities of Daily Living:  Patient reports morning stiffness for  few hours.   Patient Denies nocturnal pain.  Difficulty dressing/grooming: Denies Difficulty climbing stairs: Reports Difficulty getting out of chair: Denies Difficulty using hands for taps, buttons, cutlery, and/or writing: Reports  Review of Systems  Constitutional: Positive for fatigue.  HENT: Negative for mouth sores, mouth dryness and nose dryness.   Eyes: Positive for dryness. Negative for pain and visual  disturbance.  Respiratory: Negative for cough, hemoptysis, shortness of breath and difficulty breathing.   Cardiovascular: Negative for chest pain, palpitations, hypertension and swelling in legs/feet.  Gastrointestinal: Negative for blood in stool, constipation and diarrhea.  Endocrine: Negative for increased urination.  Genitourinary: Negative for painful urination.  Musculoskeletal: Positive for arthralgias, joint pain, myalgias, morning stiffness, muscle tenderness and myalgias. Negative for joint swelling and muscle weakness.  Skin: Positive for rash (on neck). Negative for color change, pallor, hair loss, nodules/bumps, skin tightness, ulcers and sensitivity to sunlight.  Allergic/Immunologic: Negative for susceptible to infections.  Neurological: Negative for dizziness, numbness, headaches and weakness.  Hematological: Negative for swollen glands.  Psychiatric/Behavioral: Positive for sleep disturbance. Negative for depressed mood. The patient is not nervous/anxious.     PMFS History:  Patient Active Problem List   Diagnosis Date Noted  . Fibromyalgia 12/27/2016  . Other fatigue 12/27/2016  . Primary insomnia 12/27/2016  . Primary osteoarthritis of both hands 12/27/2016  . Primary osteoarthritis of both knees 12/27/2016    Past Medical History:  Diagnosis Date  . Fibromyalgia   . Osteoarthritis   . Osteopenia     Family History  Problem Relation Age of Onset  . Melanoma Mother   . Heart disease Mother   . Hypertension Mother   . Heart attack Mother   . Heart disease Father        TRIPLE BYPASS  .  Hypercholesterolemia Father   . Heart attack Maternal Grandfather   . Stroke Maternal Grandfather   . Aneurysm Maternal Grandfather   . Heart failure Maternal Grandmother   . Heart attack Paternal Grandmother   . Heart disease Paternal Grandfather   . Kidney Stones Brother    Past Surgical History:  Procedure Laterality Date  . HYSTEROTOMY  2002  . KNEE SURGERY   1990-1991  . OVARY SURGERY  2003   LEFT OVARY REMOVAL  . toe joint replacement Bilateral 03/2018  . TUBAL LIGATION  2003   LEFT FALLOPIAN TUBE REMOVED   Social History   Social History Narrative  . Not on file    There is no immunization history on file for this patient.   Objective: Vital Signs: BP 134/79 (BP Location: Left Arm, Patient Position: Sitting, Cuff Size: Normal)   Pulse 75   Resp 12   Ht 5\' 3"  (1.6 m)   Wt 130 lb (59 kg)   BMI 23.03 kg/m    Physical Exam Vitals signs and nursing note reviewed.  Constitutional:      Appearance: She is well-developed.  HENT:     Head: Normocephalic and atraumatic.  Eyes:     Conjunctiva/sclera: Conjunctivae normal.  Neck:     Musculoskeletal: Normal range of motion.  Cardiovascular:     Rate and Rhythm: Normal rate and regular rhythm.     Heart sounds: Normal heart sounds.  Pulmonary:     Effort: Pulmonary effort is normal.     Breath sounds: Normal breath sounds.  Abdominal:     General: Bowel sounds are normal.     Palpations: Abdomen is soft.  Lymphadenopathy:     Cervical: No cervical adenopathy.  Skin:    General: Skin is warm and dry.     Capillary Refill: Capillary refill takes less than 2 seconds.  Neurological:     Mental Status: She is alert and oriented to person, place, and time.  Psychiatric:        Behavior: Behavior normal.      Musculoskeletal Exam: C-spine, thoracic spine, lumbar spine good range of motion.  No midline spinal tenderness.  No SI joint joint tenderness.  Shoulder joints, elbow joints, wrist joints, MCPs, PIPs, DIPs good range of motion with no synovitis.  She has PIP and DIP synovial thickening consistent with osteoarthritis of bilateral hands.  She has bilateral CMC joint synovial thickening and tenderness.  Hip joints, knee joints, ankle joints, MTPs, PIPs and DIPs good range of motion no synovitis.  No warmth or effusion bilateral knee joints.  No tenderness or swelling of ankle  joints.  She has warmth and mild tenderness of bilateral first MTP joints.  CDAI Exam: CDAI Score: Not documented Patient Global Assessment: Not documented; Provider Global Assessment: Not documented Swollen: Not documented; Tender: Not documented Joint Exam   Not documented   There is currently no information documented on the homunculus. Go to the Rheumatology activity and complete the homunculus joint exam.  Investigation: No additional findings.  Imaging: No results found.  Recent Labs: No results found for: WBC, HGB, PLT, NA, K, CL, CO2, GLUCOSE, BUN, CREATININE, BILITOT, ALKPHOS, AST, ALT, PROT, ALBUMIN, CALCIUM, GFRAA, QFTBGOLD, QFTBGOLDPLUS  Speciality Comments: No specialty comments available.  Procedures:  No procedures performed Allergies: Patient has no known allergies.   Assessment / Plan:     Visit Diagnoses: Primary osteoarthritis of both hands: She has PIP and DIP synovial thickening consistent with osteoarthritis of bilateral hands.  She has bilateral CMC joint synovial thickening and tenderness.  She has complete fist formation bilaterally. She uses voltaren gel topically for pain relief. Joint protection and muscle strengthening were discussed.  She was given a handout of hand exercises.  Some of these exercises were demonstrated in the office today.  Primary osteoarthritis of both knees: No warmth or effusion.  Good range of motion.  She has occasional discomfort in bilateral knee joints.  She uses voltaren gel topically and takes tramadol for pain relief.  She walks for exercise.  Primary osteoarthritis of both feet - Dr. 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  She has tenderness and warmth bilaterally. She has been having increased stiffness in the left 1st MTP joint.  She has been applying voltaren gel more frequently recently.  She will be following up with Dr. Milinda Pointer to discuss the increased joint stiffness.   Fibromyalgia: She has generalized  muscle aches and muscle tenderness.  She has been having increased myalgias for the past 1 month.  She has not been as active due to rainy weather. She was encouraged to stay active and exercise on a regular basis.  Her level of fatigue has improved.  She has been sleeping about 8 hours per night after taking Ambien 10 mg po bedtime.  Good sleep hygiene was discussed.  She continues to take Cymbalta 60 mg po daily.  She takes tramadol 50 mg 1-2 tablets BID PRN for pain relief.  A refill was sent today.   Primary insomnia - She takes Ambien 10 mg po at bedtime. She has been sleeping 8 hours per night.  Good sleep hygiene was discussed.    Other fatigue: Her level of fatigue has improved. She was encouraged to stay active and exercise regularly.   Other chronic pain: She takes Tramadol 50 mg 1-2 tablets BID PRN for pain relief.  A refill was sent to the pharmacy.   Medication monitoring encounter - Tramadol 50 mg 1-2 tablets by mouth twice a day PRN.  UDS and narcotic agreement were updated today 01/01/19.- Plan: Pain Mgmt, Profile 5 w/Conf, U, Pain Mgmt, Tramadol w/medMATCH, U   Orders: Orders Placed This Encounter  Procedures  . Pain Mgmt, Profile 5 w/Conf, U  . Pain Mgmt, Tramadol w/medMATCH, U   Meds ordered this encounter  Medications  . traMADol (ULTRAM) 50 MG tablet    Sig: TAKE 1 OR 2 TABLETS BY MOUTH TWO TIMES A DAY AS NEEDED    Dispense:  120 tablet    Refill:  0    Do no fill until 01/03/19     Follow-Up Instructions: Return in about 6 months (around 07/02/2019) for Fibromyalgia, Osteoarthritis.   Ofilia Neas, PA-C I examined and evaluated the patient with Hazel Sams PA.  Patient had no synovitis on examination.  She had osteoarthritic changes in her hands.  She continues to have some generalized pain from fibromyalgia.  She has been exercising on regular basis which has been helpful.  Pain is manageable with tramadol.  Side effects of tramadol were reviewed.  The plan of care  was discussed as noted above.  Bo Merino, MD Note - This record has been created using Editor, commissioning.  Chart creation errors have been sought, but may not always  have been located. Such creation errors do not reflect on  the standard of medical care.

## 2019-01-01 ENCOUNTER — Encounter: Payer: Self-pay | Admitting: Rheumatology

## 2019-01-01 ENCOUNTER — Ambulatory Visit (INDEPENDENT_AMBULATORY_CARE_PROVIDER_SITE_OTHER): Payer: 59 | Admitting: Rheumatology

## 2019-01-01 VITALS — BP 134/79 | HR 75 | Resp 12 | Ht 63.0 in | Wt 130.0 lb

## 2019-01-01 DIAGNOSIS — M19041 Primary osteoarthritis, right hand: Secondary | ICD-10-CM

## 2019-01-01 DIAGNOSIS — M797 Fibromyalgia: Secondary | ICD-10-CM

## 2019-01-01 DIAGNOSIS — G8929 Other chronic pain: Secondary | ICD-10-CM

## 2019-01-01 DIAGNOSIS — R5383 Other fatigue: Secondary | ICD-10-CM

## 2019-01-01 DIAGNOSIS — M19071 Primary osteoarthritis, right ankle and foot: Secondary | ICD-10-CM | POA: Diagnosis not present

## 2019-01-01 DIAGNOSIS — M170 Bilateral primary osteoarthritis of knee: Secondary | ICD-10-CM

## 2019-01-01 DIAGNOSIS — Z5181 Encounter for therapeutic drug level monitoring: Secondary | ICD-10-CM

## 2019-01-01 DIAGNOSIS — F5101 Primary insomnia: Secondary | ICD-10-CM

## 2019-01-01 DIAGNOSIS — M19042 Primary osteoarthritis, left hand: Secondary | ICD-10-CM

## 2019-01-01 DIAGNOSIS — M19072 Primary osteoarthritis, left ankle and foot: Secondary | ICD-10-CM

## 2019-01-01 MED ORDER — TRAMADOL HCL 50 MG PO TABS: ORAL_TABLET | ORAL | 0 refills | Status: DC

## 2019-01-01 NOTE — Patient Instructions (Signed)

## 2019-01-04 LAB — PAIN MGMT, PROFILE 5 W/CONF, U
AMPHETAMINES: NEGATIVE ng/mL (ref <500)
Barbiturates: NEGATIVE ng/mL (ref <300)
Benzodiazepines: NEGATIVE ng/mL (ref <100)
COCAINE METABOLITE: NEGATIVE ng/mL (ref <150)
Creatinine: 52.5 mg/dL
MARIJUANA METABOLITE: NEGATIVE ng/mL (ref <20)
Methadone Metabolite: NEGATIVE ng/mL (ref <100)
OXIDANT: NEGATIVE ug/mL (ref <200)
Opiates: NEGATIVE ng/mL (ref <100)
Oxycodone: NEGATIVE ng/mL (ref <100)
PH: 6.69 (ref 4.5–9.0)

## 2019-01-04 LAB — PAIN MGMT, TRAMADOL W/MEDMATCH, U
DESMETHYLTRAMADOL: 2832 ng/mL — AB (ref <100)
TRAMADOL: 21838 ng/mL — AB (ref <100)

## 2019-01-05 NOTE — Progress Notes (Signed)
UDS is consistent with treatment.

## 2019-01-19 ENCOUNTER — Other Ambulatory Visit: Payer: Self-pay | Admitting: Physician Assistant

## 2019-01-19 NOTE — Telephone Encounter (Signed)
ok 

## 2019-01-19 NOTE — Telephone Encounter (Signed)
Last Visit: 01/01/19 Next Visit: 07/02/19  Okay to refill Ambien?

## 2019-02-02 ENCOUNTER — Other Ambulatory Visit: Payer: Self-pay | Admitting: Physician Assistant

## 2019-02-02 NOTE — Telephone Encounter (Signed)
Last Visit: 01/01/19 Next Visit: 07/02/19 UDS: 01/01/19 Narc Agreement: 01/01/19  Okay to refill Tramadol? 

## 2019-02-18 ENCOUNTER — Other Ambulatory Visit: Payer: Self-pay | Admitting: Rheumatology

## 2019-02-18 NOTE — Telephone Encounter (Signed)
Last Visit: 01/01/2019 Next Visit: 07/02/2019  Okay to refill per Dr. Estanislado Pandy.

## 2019-03-03 ENCOUNTER — Other Ambulatory Visit: Payer: Self-pay | Admitting: Physician Assistant

## 2019-03-04 NOTE — Telephone Encounter (Signed)
Last Visit: 01/01/2019 Next Visit: 07/02/2019 UDS:01/01/2019 c/w Narc Agreement: 01/01/2019  Last fill: 02/02/2019  Okay to refill tramadol?

## 2019-04-03 ENCOUNTER — Other Ambulatory Visit: Payer: Self-pay | Admitting: Physician Assistant

## 2019-04-03 NOTE — Telephone Encounter (Signed)
Last Visit: 01/01/19 Next Visit: 07/02/19 UDS: 01/01/19 Narc Agreement: 01/01/19  Okay to refill Tramadol?

## 2019-04-15 ENCOUNTER — Other Ambulatory Visit: Payer: Self-pay | Admitting: Rheumatology

## 2019-04-15 NOTE — Telephone Encounter (Signed)
Last Visit: 01/01/2019 Next Visit: 07/02/2019  Okay to refill Ambien?

## 2019-04-15 NOTE — Telephone Encounter (Signed)
ok 

## 2019-05-03 ENCOUNTER — Other Ambulatory Visit: Payer: Self-pay | Admitting: Physician Assistant

## 2019-05-04 NOTE — Telephone Encounter (Signed)
Last Visit: 01/01/2019 Next Visit: 07/02/2019 UDS:01/01/2019 c/w Narc Agreement: 01/01/2019  Last fill: 04/03/2019  Okay to refill tramadol?

## 2019-05-16 ENCOUNTER — Other Ambulatory Visit: Payer: Self-pay | Admitting: Rheumatology

## 2019-05-18 NOTE — Telephone Encounter (Signed)
Last Visit:01/01/2019 Next Visit:07/02/2019  Okay to refill per Dr. Estanislado Pandy

## 2019-06-02 ENCOUNTER — Other Ambulatory Visit: Payer: Self-pay | Admitting: Physician Assistant

## 2019-06-02 NOTE — Telephone Encounter (Signed)
Last Visit: 01/01/2019 Next Visit: 07/02/2019 UDS:01/01/2019 c/w Narc Agreement: 01/01/2019  Last fill: 05/04/2019  Okay to refill tramadol?

## 2019-06-02 NOTE — Telephone Encounter (Signed)
Patient due July 02, 2019 appointment noted

## 2019-06-02 NOTE — Telephone Encounter (Signed)
She is due to update UDS and narcotic agreement.

## 2019-06-18 NOTE — Progress Notes (Signed)
Office Visit Note  Patient: Desiree Randall             Date of Birth: 1961-10-21           MRN: [id number]             PCP: Darcus Austin, MD (Inactive) Referring: No ref. provider found Visit Date: 07/02/2019 Occupation: @GUAROCC @  Subjective:  Generalized pain   History of Present Illness: CATELYN FRIEL is a 58 y.o. female with history of fibromyalgia and osteoarthritis.  She continues to take tramadol 50 mg 1 to 2 tablets by mouth twice daily as needed for pain relief.  She states that before taking tramadol her pain is typically an 8 out of 10 and comes down to a 5 out of 10 after taking it.  She needs a refill of tramadol today.  She states that she is been having increased pain over the past 2 to 3 weeks.  She states that her activity level has increased and she experiences more arthralgias with weather changes.  She states that yesterday she walked 3 miles so today her knees are causing some discomfort but denies any joint swelling.  She has been doing strength training occasionally which has been helping with her arthralgias and fatigue.  She continues to take Cymbalta 60 mg by mouth daily.  She has been sleeping well after taking Ambien 10 mg by mouth at bedtime.  She uses Voltaren gel topically as needed for pain relief.  She does not need a refill of Voltaren gel at this time.   Activities of Daily Living:  Patient reports morning stiffness for several hours.   Patient Reports nocturnal pain.  Difficulty dressing/grooming: Denies Difficulty climbing stairs: Denies Difficulty getting out of chair: Denies Difficulty using hands for taps, buttons, cutlery, and/or writing: Reports  Review of Systems  Constitutional: Negative for fatigue.  HENT: Positive for nose dryness. Negative for mouth sores and mouth dryness.   Eyes: Positive for itching and dryness.  Respiratory: Negative for shortness of breath, wheezing and difficulty breathing.   Cardiovascular: Negative for  chest pain, palpitations and swelling in legs/feet.  Gastrointestinal: Negative for abdominal pain, blood in stool, constipation and diarrhea.  Endocrine: Negative for increased urination.  Genitourinary: Negative for painful urination.  Musculoskeletal: Positive for arthralgias, joint pain and morning stiffness. Negative for joint swelling.  Skin: Negative for rash, hair loss and redness.  Allergic/Immunologic: Negative for susceptible to infections.  Neurological: Negative for dizziness, numbness, headaches, memory loss and weakness.  Hematological: Negative for bruising/bleeding tendency.  Psychiatric/Behavioral: Negative for confusion and sleep disturbance. The patient is not nervous/anxious.     PMFS History:  Patient Active Problem List   Diagnosis Date Noted  . Fibromyalgia 12/27/2016  . Other fatigue 12/27/2016  . Primary insomnia 12/27/2016  . Primary osteoarthritis of both hands 12/27/2016  . Primary osteoarthritis of both knees 12/27/2016    Past Medical History:  Diagnosis Date  . Fibromyalgia   . Osteoarthritis   . Osteopenia     Family History  Problem Relation Age of Onset  . Melanoma Mother   . Heart disease Mother   . Hypertension Mother   . Heart attack Mother   . Heart disease Father        TRIPLE BYPASS  . Hypercholesterolemia Father   . Heart attack Maternal Grandfather   . Stroke Maternal Grandfather   . Aneurysm Maternal Grandfather   . Heart failure Maternal Grandmother   . Heart attack  Paternal Grandmother   . Heart disease Paternal Grandfather   . Kidney Stones Brother    Past Surgical History:  Procedure Laterality Date  . HYSTEROTOMY  2002  . KNEE SURGERY  1990-1991  . OVARY SURGERY  2003   LEFT OVARY REMOVAL  . toe joint replacement Bilateral 03/2018  . TUBAL LIGATION  2003   LEFT FALLOPIAN TUBE REMOVED   Social History   Social History Narrative  . Not on file    There is no immunization history on file for this patient.    Objective: Vital Signs: BP 118/76 (BP Location: Left Arm, Patient Position: Sitting, Cuff Size: Normal)   Pulse 84   Resp 12   Ht 5\' 3"  (1.6 m)   Wt 126 lb 9.6 oz (57.4 kg)   BMI 22.43 kg/m    Physical Exam Vitals signs and nursing note reviewed.  Constitutional:      Appearance: She is well-developed.  HENT:     Head: Normocephalic and atraumatic.  Eyes:     Conjunctiva/sclera: Conjunctivae normal.  Neck:     Musculoskeletal: Normal range of motion.  Cardiovascular:     Rate and Rhythm: Normal rate and regular rhythm.     Heart sounds: Normal heart sounds.  Pulmonary:     Effort: Pulmonary effort is normal.     Breath sounds: Normal breath sounds.  Abdominal:     General: Bowel sounds are normal.     Palpations: Abdomen is soft.  Lymphadenopathy:     Cervical: No cervical adenopathy.  Skin:    General: Skin is warm and dry.     Capillary Refill: Capillary refill takes less than 2 seconds.  Neurological:     Mental Status: She is alert and oriented to person, place, and time.  Psychiatric:        Behavior: Behavior normal.      Musculoskeletal Exam: C-spine, thoracic spine, lumbar spine good range of motion.  No midline spinal tenderness.  No SI joint tenderness.  Shoulders joints, elbows, wrist joints, MCPs, PIPs, DIPs good range of motion no synovitis.  She has PIP and DIP synovial thickening consistent with osteoarthritis of bilateral hands.  Hip joints have good range of motion with no discomfort.  No tenderness over trochanteric bursa.  Knee joints have good range of motion with no warmth or effusion.  Ankle joints, MTPs, PIPs and DIPs good range of motion no synovitis.  No tenderness or swelling of ankle joints.    CDAI Exam: CDAI Score: - Patient Global: -; Provider Global: - Swollen: -; Tender: - Joint Exam   No joint exam has been documented for this visit   There is currently no information documented on the homunculus. Go to the Rheumatology activity and  complete the homunculus joint exam.  Investigation: No additional findings.  Imaging: No results found.  Recent Labs: No results found for: WBC, HGB, PLT, NA, K, CL, CO2, GLUCOSE, BUN, CREATININE, BILITOT, ALKPHOS, AST, ALT, PROT, ALBUMIN, CALCIUM, GFRAA, QFTBGOLD, QFTBGOLDPLUS  Speciality Comments: No specialty comments available.  Procedures:  No procedures performed Allergies: Patient has no known allergies.   Assessment / Plan:     Visit Diagnoses: Primary osteoarthritis of both hands -She has PIP and DIP synovial thickening consistent with osteoarthritis of bilateral hands.  She has no synovitis on exam.  She has complete fist formation bilaterally.  She experiences pain and stiffness in her hands first thing in the morning.  We discussed the importance of performing hand exercises.  Joint protection and muscle strengthening were discussed.  She can continue using Voltaren gel topically as needed for pain relief  Primary osteoarthritis of both knees - She has good range of motion with no discomfort.  No warmth or effusion was noted.  She walked 3 miles yesterday and is having some discomfort in her knee joints today.  She uses Voltaren gel topically as needed for pain relief.  We discussed the importance of lower extremity muscle strengthening.  Primary osteoarthritis of both feet - Dr. 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  She has no discomfort at this time.  She wears proper fitting shoes.  Fibromyalgia -She continues to have generalized muscle aches and muscle tenderness due to fibromyalgia.  She has been having increased myalgias for the past 2 to 3 weeks due to increasing her level of activity and due to recent weather changes.  She continues to take Cymbalta 60 mg by mouth daily.  She takes tramadol 50 mg 1 to 2 tablets by mouth twice daily for pain relief.  Her pain is typically an 8 out of 10 prior to taking tramadol and comes down to a 5 out of 10 after taking  it.  A refill of tramadol will be sent to the pharmacy.  She has chronic fatigue related to insomnia.  Overall she has been sleeping better at night since taking Ambien 10 mg by mouth at bedtime.  We discussed the importance of staying active and exercising on a regular basis.  She is starting to walk 3 miles daily.  We also discussed performing stretching exercises in the morning.  She will follow-up in the office in 6 months.  Other fatigue - She continues to have chronic fatigue.  She was encouraged to stay active and exercise on a regular basis.  She has started to walk 3 miles daily.  Primary insomnia -She has been sleeping well at night after taking Ambien 10 mg po at bedtime.   Other chronic pain - Tramadol 50 mg 1-2 tablets BID PRN for pain relief.UDS and narcotic agreement were updated today on 07/02/2019.- Plan: Pain Mgmt, Profile 5 w/Conf, U, Pain Mgmt, Tramadol w/medMATCH, U, traMADol (ULTRAM) 50 MG tablet,   Medication monitoring encounter -UDS and narcotic agreement were updated today on 07/02/2019.  Plan: Pain Mgmt, Profile 5 w/Conf, U, Pain Mgmt, Tramadol w/medMATCH, U  Orders: Orders Placed This Encounter  Procedures  . Pain Mgmt, Profile 5 w/Conf, U  . Pain Mgmt, Tramadol w/medMATCH, U   Meds ordered this encounter  Medications  . traMADol (ULTRAM) 50 MG tablet    Sig: TAKE ONE TO TWO TABLETS BY MOUTH TWO TIMES A DAY AS NEEDED    Dispense:  120 tablet    Refill:  0     Follow-Up Instructions: Return in about 6 months (around 01/02/2020) for Osteoarthritis, Fibromyalgia.   Ofilia Neas, PA-C   I examined and evaluated the patient with Hazel Sams PA.  Patient had significant osteoarthritis on my examination.  She continues to have some generalized pain from fibromyalgia.  The plan of care was discussed as noted above.  Bo Merino, MD Note - This record has been created using Editor, commissioning.  Chart creation errors have been sought, but may not always  have been  located. Such creation errors do not reflect on  the standard of medical care.

## 2019-07-02 ENCOUNTER — Encounter (INDEPENDENT_AMBULATORY_CARE_PROVIDER_SITE_OTHER): Payer: Self-pay

## 2019-07-02 ENCOUNTER — Encounter: Payer: Self-pay | Admitting: Rheumatology

## 2019-07-02 ENCOUNTER — Other Ambulatory Visit: Payer: Self-pay

## 2019-07-02 ENCOUNTER — Ambulatory Visit (INDEPENDENT_AMBULATORY_CARE_PROVIDER_SITE_OTHER): Payer: 59 | Admitting: Rheumatology

## 2019-07-02 VITALS — BP 118/76 | HR 84 | Resp 12 | Ht 63.0 in | Wt 126.6 lb

## 2019-07-02 DIAGNOSIS — M19041 Primary osteoarthritis, right hand: Secondary | ICD-10-CM

## 2019-07-02 DIAGNOSIS — M170 Bilateral primary osteoarthritis of knee: Secondary | ICD-10-CM | POA: Diagnosis not present

## 2019-07-02 DIAGNOSIS — M19071 Primary osteoarthritis, right ankle and foot: Secondary | ICD-10-CM

## 2019-07-02 DIAGNOSIS — G8929 Other chronic pain: Secondary | ICD-10-CM

## 2019-07-02 DIAGNOSIS — M797 Fibromyalgia: Secondary | ICD-10-CM

## 2019-07-02 DIAGNOSIS — M19072 Primary osteoarthritis, left ankle and foot: Secondary | ICD-10-CM

## 2019-07-02 DIAGNOSIS — F5101 Primary insomnia: Secondary | ICD-10-CM

## 2019-07-02 DIAGNOSIS — R5383 Other fatigue: Secondary | ICD-10-CM

## 2019-07-02 DIAGNOSIS — Z5181 Encounter for therapeutic drug level monitoring: Secondary | ICD-10-CM

## 2019-07-02 DIAGNOSIS — M19042 Primary osteoarthritis, left hand: Secondary | ICD-10-CM

## 2019-07-02 MED ORDER — TRAMADOL HCL 50 MG PO TABS: ORAL_TABLET | ORAL | 0 refills | Status: DC

## 2019-07-03 MED ORDER — TRAMADOL HCL 50 MG PO TABS: ORAL_TABLET | ORAL | 0 refills | Status: DC

## 2019-07-03 NOTE — Addendum Note (Signed)
Addended by: Carole Binning on: 07/03/2019 10:29 AM   Modules accepted: Orders

## 2019-07-04 LAB — PAIN MGMT, PROFILE 5 W/CONF, U
Amphetamines: NEGATIVE ng/mL
Barbiturates: NEGATIVE ng/mL
Benzodiazepines: NEGATIVE ng/mL
Cocaine Metabolite: NEGATIVE ng/mL
Creatinine: 116.1 mg/dL
Marijuana Metabolite: NEGATIVE ng/mL
Methadone Metabolite: NEGATIVE ng/mL
Opiates: NEGATIVE ng/mL
Oxidant: NEGATIVE ug/mL
Oxycodone: NEGATIVE ng/mL
pH: 6.6 (ref 4.5–9.0)

## 2019-07-04 LAB — PAIN MGMT, TRAMADOL W/MEDMATCH, U
Desmethyltramadol: 6238 ng/mL
Tramadol: >10000 ng/mL

## 2019-07-06 NOTE — Progress Notes (Signed)
UDS is consistent with treatment.

## 2019-07-17 ENCOUNTER — Other Ambulatory Visit: Payer: Self-pay | Admitting: Rheumatology

## 2019-07-17 NOTE — Telephone Encounter (Signed)
Last Visit: 07/02/19  Next Visit: 12/29/19  Okay to refill Ambien?

## 2019-07-17 NOTE — Telephone Encounter (Signed)
ok 

## 2019-08-03 ENCOUNTER — Other Ambulatory Visit: Payer: Self-pay | Admitting: Rheumatology

## 2019-08-03 DIAGNOSIS — G8929 Other chronic pain: Secondary | ICD-10-CM

## 2019-08-03 NOTE — Telephone Encounter (Signed)
Last Visit: 07/02/19 Next visit: 12/29/19 UDS: 07/02/19 Narc Agreement: 07/02/19  Okay to refill Tramadol?

## 2019-08-03 NOTE — Telephone Encounter (Signed)
I detailed discussion with the patient about the interaction of tramadol that the Cymbalta.  We also discussed the need of tapering on tramadol.  I also had a brief discussion regarding tapering Ambien.  She states she is not able to sleep without Ambien.  Increased risk of respiratory depression was also discussed.  At this point she is willing to decrease tramadol to 1 tablet p.o. twice daily.  Have advised her to use it with Tylenol if needed for pain management.  Okay to fill refill tramadol as 1 tablet p.o. twice daily.  Total 60 tablets with no refills will be given.

## 2019-08-12 ENCOUNTER — Other Ambulatory Visit: Payer: Self-pay | Admitting: Rheumatology

## 2019-08-12 NOTE — Telephone Encounter (Signed)
Last Visit: 07/02/19  Next Visit: 12/29/19  Okay to refill per Dr. Estanislado Pandy

## 2019-09-02 ENCOUNTER — Other Ambulatory Visit: Payer: Self-pay | Admitting: Rheumatology

## 2019-09-02 DIAGNOSIS — G8929 Other chronic pain: Secondary | ICD-10-CM

## 2019-09-02 NOTE — Telephone Encounter (Signed)
Last Visit: 07/02/19 Next visit: 12/29/19 UDS: 07/02/19 Narc Agreement: 07/02/19  Okay to refill Tramadol?

## 2019-10-04 ENCOUNTER — Other Ambulatory Visit: Payer: Self-pay | Admitting: Rheumatology

## 2019-10-04 DIAGNOSIS — G8929 Other chronic pain: Secondary | ICD-10-CM

## 2019-10-05 NOTE — Telephone Encounter (Signed)
Last Visit: 07/02/19 Next visit: 12/29/19 UDS: 07/02/19 Narc Agreement: 07/02/19  Okay to refill Tramadol?

## 2019-10-16 ENCOUNTER — Other Ambulatory Visit: Payer: Self-pay | Admitting: Rheumatology

## 2019-10-19 NOTE — Telephone Encounter (Signed)
Notes recorded by Ofilia Neas, PA-C on 07/06/2019 at 8:15 AM EDT  UDS is consistent with treatment.   Last RF 07/17/2019 Last appt 07/02/2019 Next appt 2/92/2021

## 2019-11-04 ENCOUNTER — Other Ambulatory Visit: Payer: Self-pay | Admitting: Rheumatology

## 2019-11-04 DIAGNOSIS — G8929 Other chronic pain: Secondary | ICD-10-CM

## 2019-11-04 NOTE — Telephone Encounter (Signed)
Last Visit: 07/02/2019 Next Visit: 12/29/2019 UDS: 07/02/2019 c/w Narc Agreement: 07/02/2019  Last fill: 10/05/2019   Okay to refill tramadol?

## 2019-11-09 ENCOUNTER — Other Ambulatory Visit: Payer: Self-pay | Admitting: Rheumatology

## 2019-11-09 NOTE — Telephone Encounter (Signed)
Last Visit: 07/02/2019 Next Visit: 12/29/2019  Okay to refill per Dr. Estanislado Pandy.

## 2019-12-07 ENCOUNTER — Other Ambulatory Visit: Payer: Self-pay | Admitting: Rheumatology

## 2019-12-07 DIAGNOSIS — G8929 Other chronic pain: Secondary | ICD-10-CM

## 2019-12-07 NOTE — Telephone Encounter (Signed)
Last Visit: 07/02/2019 Next Visit: 12/29/2019 UDS: 07/02/2019 c/w Narc Agreement: 07/02/2019  Last fill: 11/04/19  Okay to refill Tramadol?

## 2019-12-21 NOTE — Progress Notes (Signed)
Virtual Visit via Telephone Note  I connected with Desiree Randall on 12/29/19 at  3:45 PM EST by telephone and verified that I am speaking with the correct person using two identifiers.  Location: Patient: Home  Provider: Clinic  This service was conducted via virtual visit.  The patient was located at home. I was located in my office.  Consent was obtained prior to the virtual visit and is aware of possible charges through their insurance for this visit.  The patient is an established patient.  Dr. Estanislado Pandy, MD conducted the virtual visit and Hazel Sams, PA-C acted as scribe during the service.  Office staff helped with scheduling follow up visits after the service was conducted.   I discussed the limitations, risks, security and privacy concerns of performing an evaluation and management service by telephone and the availability of in person appointments. I also discussed with the patient that there may be a patient responsible charge related to this service. The patient expressed understanding and agreed to proceed.  CC: Lower back pain  History of Present Illness: Patient is a 59 year old female with a past medical history of osteoarthritis and fibromyalgia. She continues to take Cymbalta 60 mg 1 capsule by mouth daily.  She states her last fibromyalgia flare was 1 month ago but her discomfort has been manageable since then.  She has been experiencing increased lower back pain for the past 2 weeks.  She states the discomfort has been improving the past 2 days.  She states she has backed off of exercising this week, which seems to be helping. She has been using voltaren gel topically as needed. She has chronic pain in both knee joints, which is exacerbated by climbing steps and with cooler weather.  She states her pain has been manageable with the reduced dose of Tramadol.  She is taking tramadol 50 mg 1 tablet BID for pain relief. She has ongoing fatigue secondary to insomnia. She takes ambien  10 mg po at bedtime for insomnia.  Review of Systems  Constitutional: Positive for malaise/fatigue. Negative for fever.  HENT: Negative for congestion.   Eyes: Negative for photophobia, pain, discharge and redness.  Respiratory: Negative for cough, shortness of breath and wheezing.   Cardiovascular: Negative for chest pain, palpitations and leg swelling.  Gastrointestinal: Negative for blood in stool, constipation and diarrhea.  Genitourinary: Negative for dysuria and frequency.  Musculoskeletal: Positive for back pain and joint pain. Negative for myalgias and neck pain.       +Morning stiffness   Skin: Negative for rash.  Neurological: Negative for dizziness, weakness and headaches.  Psychiatric/Behavioral: Negative for depression and memory loss. The patient has insomnia (Ambien 10 mg po at bedtime for insomnia). The patient is not nervous/anxious.       Observations/Objective: Physical Exam  Constitutional: She is oriented to person, place, and time.  Neurological: She is alert and oriented to person, place, and time.  Psychiatric: Mood, memory, affect and judgment normal.   Patient reports morning stiffness for 3 hours.   Patient denies nocturnal pain.  Difficulty dressing/grooming: Denies Difficulty climbing stairs: Reports Difficulty getting out of chair: Denies Difficulty using hands for taps, buttons, cutlery, and/or writing: Reports   Assessment and Plan: Visit Diagnoses: Primary osteoarthritis of both hands -She is not having any hand pain or joint inflammation at this time.  Joint protection and muscle strengthening were discussed. She was advised to notify us if she develops increased joint pain or joint swelling.  She will follow up in 6 months.   Primary osteoarthritis of both knees - She has intermittent pain in both knee joints.  She has no joint swelling or mechanical symptoms at this time.  Her discomfort is exacerbated by climbing steps and cooler weather  temperatures.  She uses voltaren gel topically as needed for pain relief.  She continues to workout on a regular basis.  Primary osteoarthritis of both feet - Dr. 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  She has no discomfort at this time.  She wears proper fitting shoes.  Fibromyalgia - Her most recent fibromyalgia flare was 1 month ago.  Her discomfort has been manageable on tramadol 50 mg 1 tablets BID for pain relief. She has been tolerating the reduced dose of tramadol.  She continues to take Cymbalta 60 mg by mouth daily.  She has chronic fatigue secondary to insomnia. She takes ambien 10 mg po at bedtime for insomnia. She continues to exercise on a regular basis.  We discussed the importance of regular exercise and good sleep hygiene.  Other fatigue - She has chronic fatigue secondary to insomnia.  We discussed the importance of regular exercise and good sleep hygiene.  Primary insomnia -She takes Ambien 10 mg po at bedtime for insomnia.  We discussed the importance of good sleep hygiene.    Other chronic pain - She has reduced the dose of tramadol to 50 mg 1 tablet BID for pain relief.  Her discomfort has been manageable on the current dose.   Medication monitoring encounter: UDS was updated on 07/02/19.  She is due to update UDS. Future order placed today.   Follow Up Instructions: She will follow up in 6 months.    I discussed the assessment and treatment plan with the patient. The patient was provided an opportunity to ask questions and all were answered. The patient agreed with the plan and demonstrated an understanding of the instructions.   The patient was advised to call back or seek an in-person evaluation if the symptoms worsen or if the condition fails to improve as anticipated.  I provided 30 minutes of non-face-to-face time during this encounter.   Bo Merino, MD   Scribed by-  Hazel Sams, PA-C

## 2019-12-29 ENCOUNTER — Encounter: Payer: Self-pay | Admitting: Rheumatology

## 2019-12-29 ENCOUNTER — Telehealth (INDEPENDENT_AMBULATORY_CARE_PROVIDER_SITE_OTHER): Payer: 59 | Admitting: Rheumatology

## 2019-12-29 ENCOUNTER — Other Ambulatory Visit: Payer: Self-pay

## 2019-12-29 DIAGNOSIS — M19042 Primary osteoarthritis, left hand: Secondary | ICD-10-CM

## 2019-12-29 DIAGNOSIS — M19071 Primary osteoarthritis, right ankle and foot: Secondary | ICD-10-CM

## 2019-12-29 DIAGNOSIS — M170 Bilateral primary osteoarthritis of knee: Secondary | ICD-10-CM

## 2019-12-29 DIAGNOSIS — M797 Fibromyalgia: Secondary | ICD-10-CM

## 2019-12-29 DIAGNOSIS — Z5181 Encounter for therapeutic drug level monitoring: Secondary | ICD-10-CM

## 2019-12-29 DIAGNOSIS — M19041 Primary osteoarthritis, right hand: Secondary | ICD-10-CM | POA: Diagnosis not present

## 2019-12-29 DIAGNOSIS — F5101 Primary insomnia: Secondary | ICD-10-CM

## 2019-12-29 DIAGNOSIS — R5383 Other fatigue: Secondary | ICD-10-CM

## 2019-12-29 DIAGNOSIS — G8929 Other chronic pain: Secondary | ICD-10-CM

## 2019-12-29 DIAGNOSIS — M19072 Primary osteoarthritis, left ankle and foot: Secondary | ICD-10-CM

## 2020-01-01 ENCOUNTER — Other Ambulatory Visit: Payer: Self-pay

## 2020-01-01 DIAGNOSIS — Z5181 Encounter for therapeutic drug level monitoring: Secondary | ICD-10-CM

## 2020-01-01 DIAGNOSIS — G8929 Other chronic pain: Secondary | ICD-10-CM

## 2020-01-03 LAB — PAIN MGMT, PROFILE 5 W/CONF, U
Amphetamines: NEGATIVE ng/mL
Barbiturates: NEGATIVE ng/mL
Benzodiazepines: NEGATIVE ng/mL
Cocaine Metabolite: NEGATIVE ng/mL
Creatinine: 65.4 mg/dL
Marijuana Metabolite: NEGATIVE ng/mL
Methadone Metabolite: NEGATIVE ng/mL
Opiates: NEGATIVE ng/mL
Oxidant: NEGATIVE ug/mL
Oxycodone: NEGATIVE ng/mL
pH: 7.3 (ref 4.5–9.0)

## 2020-01-03 LAB — PAIN MGMT, TRAMADOL W/MEDMATCH, U
Desmethyltramadol: 1667 ng/mL
Tramadol: 4127 ng/mL

## 2020-01-04 NOTE — Progress Notes (Signed)
Consistent with treatment

## 2020-01-07 ENCOUNTER — Other Ambulatory Visit: Payer: Self-pay | Admitting: Rheumatology

## 2020-01-07 DIAGNOSIS — G8929 Other chronic pain: Secondary | ICD-10-CM

## 2020-01-08 NOTE — Telephone Encounter (Signed)
Last Visit: 12/29/19 Next Visit: 06/28/20 UDS: 01/01/20 Narc Agreement: 07/02/19  Last Fill: 12/07/19  Okay to refill tramadol?

## 2020-01-18 ENCOUNTER — Other Ambulatory Visit: Payer: Self-pay | Admitting: Physician Assistant

## 2020-01-18 NOTE — Telephone Encounter (Signed)
Last Visit: 12/29/19 Next Visit: 06/28/20  Okay to refill Ambien?

## 2020-02-08 ENCOUNTER — Other Ambulatory Visit: Payer: Self-pay | Admitting: Physician Assistant

## 2020-02-08 DIAGNOSIS — G8929 Other chronic pain: Secondary | ICD-10-CM

## 2020-02-08 NOTE — Telephone Encounter (Signed)
Last Visit: 12/29/19 Next Visit: 06/28/20 UDS: 01/01/20 Narc Agreement: 07/02/19  Last Fill: 01/08/20  Okay to refill tramadol?

## 2020-02-10 ENCOUNTER — Other Ambulatory Visit: Payer: Self-pay | Admitting: Rheumatology

## 2020-02-10 NOTE — Telephone Encounter (Signed)
Last Visit: 12/29/19 Next Visit: 06/28/20  Okay to refill per Dr. Estanislado Pandy

## 2020-03-10 ENCOUNTER — Other Ambulatory Visit: Payer: Self-pay | Admitting: Physician Assistant

## 2020-03-10 DIAGNOSIS — G8929 Other chronic pain: Secondary | ICD-10-CM

## 2020-03-10 NOTE — Telephone Encounter (Signed)
Last Visit: 12/29/2019 telemedicine  Next Visit: 06/28/2020 UDS:01/01/2020 c/w Narc Agreement: 07/02/2019   Last fill: 02/08/2020   Okay to refill tramadol?

## 2020-04-08 ENCOUNTER — Other Ambulatory Visit: Payer: Self-pay | Admitting: Rheumatology

## 2020-04-08 DIAGNOSIS — G8929 Other chronic pain: Secondary | ICD-10-CM

## 2020-04-08 NOTE — Telephone Encounter (Signed)
Last Visit: 12/29/2019 telemedicine  Next Visit: 06/28/2020 UDS:01/01/2020 c/w Narc Agreement: 07/02/2019  Last Fill: 03/10/2020  Okay to refill tramadol?

## 2020-04-18 ENCOUNTER — Other Ambulatory Visit: Payer: Self-pay | Admitting: Physician Assistant

## 2020-04-19 NOTE — Telephone Encounter (Signed)
Last Visit: 12/29/2019 telemedicine  Next Visit: 06/28/2020  Last Fill: 01/18/2020  Okay to refill Ambien?

## 2020-05-09 ENCOUNTER — Other Ambulatory Visit: Payer: Self-pay | Admitting: Physician Assistant

## 2020-05-09 DIAGNOSIS — G8929 Other chronic pain: Secondary | ICD-10-CM

## 2020-05-09 NOTE — Telephone Encounter (Signed)
Last Visit:  12/29/2019 telemedicine  Next Visit: 06/28/2020 UDS: 01/01/2020 c/w Narc Agreement: 07/02/2019  Last Fill: 04/08/2020  Okay to refill Tramadol?

## 2020-05-10 ENCOUNTER — Other Ambulatory Visit: Payer: Self-pay | Admitting: Rheumatology

## 2020-05-10 NOTE — Telephone Encounter (Signed)
Last Visit:12/29/2019 telemedicine Next Visit:06/28/2020  Okay to refill per Dr. Estanislado Pandy

## 2020-06-13 ENCOUNTER — Other Ambulatory Visit: Payer: Self-pay | Admitting: Rheumatology

## 2020-06-13 DIAGNOSIS — G8929 Other chronic pain: Secondary | ICD-10-CM

## 2020-06-13 NOTE — Telephone Encounter (Signed)
Last Visit: 12/29/2019 telemedicine Next Visit: 06/28/2020 UDS: 01/01/2020 c/w Narc Agreement: 07/02/2019  Last Fill: 05/09/2020  Okay to refill Tramadol?

## 2020-06-14 NOTE — Progress Notes (Signed)
Office Visit Note  Patient: Desiree Randall             Date of Birth: 1961/11/08           MRN: [id number]             PCP: Darcus Austin, MD (Inactive) Referring: No ref. provider found Visit Date: 06/28/2020 Occupation: @GUAROCC @  Subjective:  Pain in multiple joints and muscles   History of Present Illness: Desiree Randall is a 59 y.o. female with history of fibromyalgia and osteoarthritis.  She states she had a fibromyalgia flare about 2 months ago with generalized pain and discomfort.  Which is gradually getting better.  She still have a lot of stiffness in her muscles.  She complains of discomfort in her hands and her feet.  She states she has chronic insomnia and she is a still taking Ambien 10 mg p.o. nightly.  She has tried tapering the dose but could not tolerate it.  The pain is manageable with Cymbalta and tramadol.  Activities of Daily Living:  Patient reports morning stiffness for 2-3  hours.   Patient Reports nocturnal pain.  Difficulty dressing/grooming: Denies Difficulty climbing stairs: Denies Difficulty getting out of chair: Denies Difficulty using hands for taps, buttons, cutlery, and/or writing: Reports  Review of Systems  Constitutional: Positive for fatigue.  HENT: Positive for mouth dryness. Negative for mouth sores and nose dryness.   Eyes: Positive for itching and dryness.  Respiratory: Negative for shortness of breath and difficulty breathing.   Cardiovascular: Negative for chest pain and palpitations.  Gastrointestinal: Negative for blood in stool, constipation and diarrhea.  Endocrine: Negative for increased urination.  Genitourinary: Negative for difficulty urinating.  Musculoskeletal: Positive for arthralgias, joint pain, joint swelling, myalgias, morning stiffness, muscle tenderness and myalgias.  Skin: Negative for color change, rash and redness.  Allergic/Immunologic: Negative for susceptible to infections.  Neurological: Positive for  numbness and weakness. Negative for dizziness, headaches and memory loss.  Hematological: Positive for bruising/bleeding tendency.  Psychiatric/Behavioral: Negative for confusion.    PMFS History:  Patient Active Problem List   Diagnosis Date Noted   Fibromyalgia 12/27/2016   Other fatigue 12/27/2016   Primary insomnia 12/27/2016   Primary osteoarthritis of both hands 12/27/2016   Primary osteoarthritis of both knees 12/27/2016    Past Medical History:  Diagnosis Date   Fibromyalgia    Osteoarthritis    Osteopenia     Family History  Problem Relation Age of Onset   Melanoma Mother    Heart disease Mother    Hypertension Mother    Heart attack Mother    Heart disease Father        TRIPLE BYPASS   Hypercholesterolemia Father    Heart attack Maternal Grandfather    Stroke Maternal Grandfather    Aneurysm Maternal Grandfather    Heart failure Maternal Grandmother    Heart attack Paternal Grandmother    Heart disease Paternal Grandfather    Kidney Stones Brother    Past Surgical History:  Procedure Laterality Date   HYSTEROTOMY  2002   KNEE SURGERY  1990-1991   OVARY SURGERY  2003   LEFT OVARY REMOVAL   toe joint replacement Bilateral 03/2018   TUBAL LIGATION  2003   LEFT FALLOPIAN TUBE REMOVED   Social History   Social History Narrative   Not on file   Immunization History  Administered Date(s) Administered   PFIZER SARS-COV-2 Vaccination 02/21/2020, 03/20/2020     Objective: Vital Signs:  BP 122/77 (BP Location: Left Arm, Patient Position: Sitting, Cuff Size: Normal)    Pulse 93    Resp 13    Ht 5\' 3"  (1.6 m)    Wt 127 lb 9.6 oz (57.9 kg)    BMI 22.60 kg/m    Physical Exam Vitals and nursing note reviewed.  Constitutional:      Appearance: She is well-developed.  HENT:     Head: Normocephalic and atraumatic.  Eyes:     Conjunctiva/sclera: Conjunctivae normal.  Cardiovascular:     Rate and Rhythm: Normal rate and regular  rhythm.     Heart sounds: Normal heart sounds.  Pulmonary:     Effort: Pulmonary effort is normal.     Breath sounds: Normal breath sounds.  Abdominal:     General: Bowel sounds are normal.     Palpations: Abdomen is soft.  Musculoskeletal:     Cervical back: Normal range of motion.  Lymphadenopathy:     Cervical: No cervical adenopathy.  Skin:    General: Skin is warm and dry.     Capillary Refill: Capillary refill takes less than 2 seconds.  Neurological:     Mental Status: She is alert and oriented to person, place, and time.  Psychiatric:        Behavior: Behavior normal.      Musculoskeletal Exam: She has stiffness with range of motion of her cervical spine.  She had good flexibility in her lumbar spine with no SI joint tenderness.  Shoulder joints, elbow joints, wrist joints, MCPs PIPs and DIPs with good range of motion.  She has bilateral PIP and DIP thickening in her hands and feet consistent with osteoarthritis.  Hip joints, knee joints were in good range of motion with no synovitis.  She has generalized hyperalgesia and positive tender points.  CDAI Exam: CDAI Score: -- Patient Global: --; Provider Global: -- Swollen: --; Tender: -- Joint Exam 06/28/2020   No joint exam has been documented for this visit   There is currently no information documented on the homunculus. Go to the Rheumatology activity and complete the homunculus joint exam.  Investigation: No additional findings.  Imaging: No results found.  Recent Labs: No results found for: WBC, HGB, PLT, NA, K, CL, CO2, GLUCOSE, BUN, CREATININE, BILITOT, ALKPHOS, AST, ALT, PROT, ALBUMIN, CALCIUM, GFRAA, QFTBGOLD, QFTBGOLDPLUS  Speciality Comments: No specialty comments available.  Procedures:  No procedures performed Allergies: Patient has no known allergies.   Assessment / Plan:     Visit Diagnoses: Primary osteoarthritis of both hands-she continues to have pain and stiffness in her hands.  Joint  protection muscle strengthening was discussed.  Primary osteoarthritis of both knees-doing well currently.  She is off-and-on discomfort.  Primary osteoarthritis of both feet - Dr. 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   Fibromyalgia-she had recent flare of fibromyalgia.  She complains of generalized discomfort and positive tender points.  Pain is manageable with Cymbalta and tramadol.  Other fatigue-secondary to fibromyalgia and insomnia.  Primary insomnia - Ambien 10 mg po at bedtime for insomnia.  She is unable to sleep without Ambien.  She has tried tapering dose and could not tolerate it.  Medication monitoring encounter - tramadol. UDS: 01/01/2020 narcotic agreement: 07/02/2019 - Plan: DRUG MONITOR, TRAMADOL,QN, URINE, DRUG MONITOR, PANEL 5, W/CONF, URINE  Other chronic pain  Osteoporosis screening-her last bone density was about 5 years ago.  I have advised her to discuss this further with her GYN.  Patient is fully  vaccinated against COVID-19.  Use of mask, social distancing and hand hygiene was emphasized.  Orders: Orders Placed This Encounter  Procedures   DRUG MONITOR, TRAMADOL,QN, URINE   DRUG MONITOR, PANEL 5, W/CONF, URINE   No orders of the defined types were placed in this encounter.     Follow-Up Instructions: Return in about 6 months (around 12/29/2020) for Osteoarthritis.   Bo Merino, MD  Note - This record has been created using Editor, commissioning.  Chart creation errors have been sought, but may not always  have been located. Such creation errors do not reflect on  the standard of medical care.

## 2020-06-28 ENCOUNTER — Encounter: Payer: Self-pay | Admitting: Rheumatology

## 2020-06-28 ENCOUNTER — Ambulatory Visit (INDEPENDENT_AMBULATORY_CARE_PROVIDER_SITE_OTHER): Payer: 59 | Admitting: Rheumatology

## 2020-06-28 ENCOUNTER — Other Ambulatory Visit: Payer: Self-pay

## 2020-06-28 VITALS — BP 122/77 | HR 93 | Resp 13 | Ht 63.0 in | Wt 127.6 lb

## 2020-06-28 DIAGNOSIS — M19042 Primary osteoarthritis, left hand: Secondary | ICD-10-CM

## 2020-06-28 DIAGNOSIS — M19041 Primary osteoarthritis, right hand: Secondary | ICD-10-CM | POA: Diagnosis not present

## 2020-06-28 DIAGNOSIS — M19072 Primary osteoarthritis, left ankle and foot: Secondary | ICD-10-CM

## 2020-06-28 DIAGNOSIS — M170 Bilateral primary osteoarthritis of knee: Secondary | ICD-10-CM | POA: Diagnosis not present

## 2020-06-28 DIAGNOSIS — M19071 Primary osteoarthritis, right ankle and foot: Secondary | ICD-10-CM | POA: Diagnosis not present

## 2020-06-28 DIAGNOSIS — M797 Fibromyalgia: Secondary | ICD-10-CM | POA: Diagnosis not present

## 2020-06-28 DIAGNOSIS — Z5181 Encounter for therapeutic drug level monitoring: Secondary | ICD-10-CM

## 2020-06-28 DIAGNOSIS — R5383 Other fatigue: Secondary | ICD-10-CM

## 2020-06-28 DIAGNOSIS — F5101 Primary insomnia: Secondary | ICD-10-CM

## 2020-06-28 DIAGNOSIS — Z13820 Encounter for screening for osteoporosis: Secondary | ICD-10-CM

## 2020-06-28 DIAGNOSIS — G8929 Other chronic pain: Secondary | ICD-10-CM

## 2020-06-30 LAB — DRUG MONITOR, PANEL 5, W/CONF, URINE
Amphetamines: NEGATIVE ng/mL (ref <500)
Barbiturates: NEGATIVE ng/mL (ref <300)
Benzodiazepines: NEGATIVE ng/mL (ref <100)
Cocaine Metabolite: NEGATIVE ng/mL (ref <150)
Creatinine: 87.6 mg/dL
Marijuana Metabolite: NEGATIVE ng/mL (ref <20)
Methadone Metabolite: NEGATIVE ng/mL (ref <100)
Opiates: NEGATIVE ng/mL (ref <100)
Oxidant: NEGATIVE ug/mL
Oxycodone: NEGATIVE ng/mL (ref <100)
pH: 6.5 (ref 4.5–9.0)

## 2020-06-30 LAB — DRUG MONITOR, TRAMADOL,QN, URINE
Desmethyltramadol: 3064 ng/mL — ABNORMAL HIGH (ref <100)
Tramadol: >10000 ng/mL — ABNORMAL HIGH (ref <100)

## 2020-06-30 LAB — DM TEMPLATE

## 2020-06-30 NOTE — Progress Notes (Signed)
Consistent with tramadol use

## 2020-07-13 ENCOUNTER — Other Ambulatory Visit: Payer: Self-pay | Admitting: *Deleted

## 2020-07-13 DIAGNOSIS — G8929 Other chronic pain: Secondary | ICD-10-CM

## 2020-07-13 MED ORDER — TRAMADOL HCL 50 MG PO TABS: 50.0000 mg | ORAL_TABLET | Freq: Two times a day (BID) | ORAL | 0 refills | Status: DC | PRN

## 2020-07-13 NOTE — Telephone Encounter (Signed)
Refill request received via fax  Last Visit: 01/04/2020 Next Visit: 01/03/2021 UDS: 06/28/2020 Narc Agreement: 06/28/2020  Last Fill: 06/13/2020  Okay to refill Tramadol?

## 2020-07-19 ENCOUNTER — Other Ambulatory Visit: Payer: Self-pay | Admitting: Physician Assistant

## 2020-07-19 NOTE — Telephone Encounter (Signed)
Patient advised to reduce Ambien to 5 mg p.o. nightly as tolerated.  When she is ready we will reduce the dose of Ambien to 5 mg. Patient verbalized understanding.

## 2020-07-19 NOTE — Telephone Encounter (Signed)
Please advise patient to reduce Ambien to 5 mg p.o. nightly as tolerated.  When she is ready we will reduce the dose of Ambien to 5 mg.

## 2020-07-19 NOTE — Telephone Encounter (Signed)
Last Visit: 01/04/2020 Next Visit: 01/03/2021  Last Fill: 04/19/2020  Okay to refill Ambien?

## 2020-08-10 ENCOUNTER — Other Ambulatory Visit: Payer: Self-pay | Admitting: Rheumatology

## 2020-08-10 NOTE — Telephone Encounter (Signed)
Last Visit:01/04/2020 Next Visit:01/03/2021  Okay to refill per Dr. Estanislado Pandy

## 2020-08-12 ENCOUNTER — Other Ambulatory Visit: Payer: Self-pay | Admitting: Physician Assistant

## 2020-08-12 DIAGNOSIS — G8929 Other chronic pain: Secondary | ICD-10-CM

## 2020-08-12 NOTE — Telephone Encounter (Signed)
Last Visit: 06/28/2020 Next Visit: 01/03/2021 UDS:06/28/2020 c/w Narc Agreement: 06/28/2020  Last Fill: 07/13/2020   Okay to refill tramadol?

## 2020-09-01 ENCOUNTER — Other Ambulatory Visit: Payer: Self-pay | Admitting: Nurse Practitioner

## 2020-09-01 DIAGNOSIS — Z1231 Encounter for screening mammogram for malignant neoplasm of breast: Secondary | ICD-10-CM

## 2020-09-13 ENCOUNTER — Other Ambulatory Visit: Payer: Self-pay | Admitting: *Deleted

## 2020-09-13 DIAGNOSIS — G8929 Other chronic pain: Secondary | ICD-10-CM

## 2020-09-13 MED ORDER — TRAMADOL HCL 50 MG PO TABS: 50.0000 mg | ORAL_TABLET | Freq: Two times a day (BID) | ORAL | 0 refills | Status: DC | PRN

## 2020-09-13 NOTE — Telephone Encounter (Signed)
Last Visit: 06/28/2020 Next Visit: 01/03/2021 UDS:06/28/2020 c/w Narc Agreement: 06/28/2020  Last Fill: 08/12/2020  Okay to refill tramadol?

## 2020-09-14 ENCOUNTER — Ambulatory Visit: Payer: 59

## 2020-09-19 ENCOUNTER — Other Ambulatory Visit: Payer: Self-pay

## 2020-09-19 ENCOUNTER — Ambulatory Visit
Admission: RE | Admit: 2020-09-19 | Discharge: 2020-09-19 | Disposition: A | Discharge status: Home / Self Care | Payer: 59 | Source: Ambulatory Visit | Attending: Nurse Practitioner | Admitting: Nurse Practitioner

## 2020-09-19 DIAGNOSIS — Z1231 Encounter for screening mammogram for malignant neoplasm of breast: Secondary | ICD-10-CM

## 2020-09-22 ENCOUNTER — Other Ambulatory Visit: Payer: Self-pay

## 2020-09-22 ENCOUNTER — Ambulatory Visit (INDEPENDENT_AMBULATORY_CARE_PROVIDER_SITE_OTHER): Payer: 59 | Admitting: Nurse Practitioner

## 2020-09-22 ENCOUNTER — Encounter: Payer: Self-pay | Admitting: Nurse Practitioner

## 2020-09-22 VITALS — BP 118/78 | Ht 63.0 in | Wt 126.0 lb

## 2020-09-22 DIAGNOSIS — Z13820 Encounter for screening for osteoporosis: Secondary | ICD-10-CM | POA: Diagnosis not present

## 2020-09-22 DIAGNOSIS — Z90710 Acquired absence of both cervix and uterus: Secondary | ICD-10-CM

## 2020-09-22 DIAGNOSIS — M8588 Other specified disorders of bone density and structure, other site: Secondary | ICD-10-CM

## 2020-09-22 DIAGNOSIS — Z01419 Encounter for gynecological examination (general) (routine) without abnormal findings: Secondary | ICD-10-CM

## 2020-09-22 NOTE — Patient Instructions (Addendum)
Schedule colonoscopy! [facility] [phone] [place], [place] [address]   Health Maintenance for Postmenopausal Women Menopause is a normal process in which your ability to get pregnant comes to an end. This process happens slowly over many months or years, usually between the ages of 83 and 22. Menopause is complete when you have missed your menstrual periods for 12 months. It is important to talk with your health care provider about some of the most common conditions that affect women after menopause (postmenopausal women). These include heart disease, cancer, and bone loss (osteoporosis). Adopting a healthy lifestyle and getting preventive care can help to promote your health and wellness. The actions you take can also lower your chances of developing some of these common conditions. What should I know about menopause? During menopause, you may get a number of symptoms, such as:  Hot flashes. These can be moderate or severe.  Night sweats.  Decrease in sex drive.  Mood swings.  Headaches.  Tiredness.  Irritability.  Memory problems.  Insomnia. Choosing to treat or not to treat these symptoms is a decision that you make with your health care provider. Do I need hormone replacement therapy?  Hormone replacement therapy is effective in treating symptoms that are caused by menopause, such as hot flashes and night sweats.  Hormone replacement carries certain risks, especially as you become older. If you are thinking about using estrogen or estrogen with progestin, discuss the benefits and risks with your health care provider. What is my risk for heart disease and stroke? The risk of heart disease, heart attack, and stroke increases as you age. One of the causes may be a change in the body's hormones during menopause. This can affect how your body uses dietary fats, triglycerides, and cholesterol. Heart attack and stroke are medical emergencies. There are many things  that you can do to help prevent heart disease and stroke. Watch your blood pressure  High blood pressure causes heart disease and increases the risk of stroke. This is more likely to develop in people who have high blood pressure readings, are of African descent, or are overweight.  Have your blood pressure checked: ? Every 3-5 years if you are 103-67 years of age. ? Every year if you are 21 years old or older. Eat a healthy diet   Eat a diet that includes plenty of vegetables, fruits, low-fat dairy products, and lean protein.  Do not eat a lot of foods that are high in solid fats, added sugars, or sodium. Get regular exercise Get regular exercise. This is one of the most important things you can do for your health. Most adults should:  Try to exercise for at least 150 minutes each week. The exercise should increase your heart rate and make you sweat (moderate-intensity exercise).  Try to do strengthening exercises at least twice each week. Do these in addition to the moderate-intensity exercise.  Spend less time sitting. Even light physical activity can be beneficial. Other tips  Work with your health care provider to achieve or maintain a healthy weight.  Do not use any products that contain nicotine or tobacco, such as cigarettes, e-cigarettes, and chewing tobacco. If you need help quitting, ask your health care provider.  Know your numbers. Ask your health care provider to check your cholesterol and your blood sugar (glucose). Continue to have your blood tested as directed by your health care provider. Do I need screening for cancer? Depending on your health history and family  history, you may need to have cancer screening at different stages of your life. This may include screening for:  Breast cancer.  Cervical cancer.  Lung cancer.  Colorectal cancer. What is my risk for osteoporosis? After menopause, you may be at increased risk for osteoporosis. Osteoporosis is a  condition in which bone destruction happens more quickly than new bone creation. To help prevent osteoporosis or the bone fractures that can happen because of osteoporosis, you may take the following actions:  If you are 4-69 years old, get at least 1,000 mg of calcium and at least 600 mg of vitamin D per day.  If you are older than age 49 but younger than age 46, get at least 1,200 mg of calcium and at least 600 mg of vitamin D per day.  If you are older than age 79, get at least 1,200 mg of calcium and at least 800 mg of vitamin D per day. Smoking and drinking excessive alcohol increase the risk of osteoporosis. Eat foods that are rich in calcium and vitamin D, and do weight-bearing exercises several times each week as directed by your health care provider. How does menopause affect my mental health? Depression may occur at any age, but it is more common as you become older. Common symptoms of depression include:  Low or sad mood.  Changes in sleep patterns.  Changes in appetite or eating patterns.  Feeling an overall lack of motivation or enjoyment of activities that you previously enjoyed.  Frequent crying spells. Talk with your health care provider if you think that you are experiencing depression. General instructions See your health care provider for regular wellness exams and vaccines. This may include:  Scheduling regular health, dental, and eye exams.  Getting and maintaining your vaccines. These include: ? Influenza vaccine. Get this vaccine each year before the flu season begins. ? Pneumonia vaccine. ? Shingles vaccine. ? Tetanus, diphtheria, and pertussis (Tdap) booster vaccine. Your health care provider may also recommend other immunizations. Tell your health care provider if you have ever been abused or do not feel safe at home. Summary  Menopause is a normal process in which your ability to get pregnant comes to an end.  This condition causes hot flashes, night  sweats, decreased interest in sex, mood swings, headaches, or lack of sleep.  Treatment for this condition may include hormone replacement therapy.  Take actions to keep yourself healthy, including exercising regularly, eating a healthy diet, watching your weight, and checking your blood pressure and blood sugar levels.  Get screened for cancer and depression. Make sure that you are up to date with all your vaccines. This information is not intended to replace advice given to you by your health care provider. Make sure you discuss any questions you have with your health care provider. Document Revised: 10/29/2018 Document Reviewed: 10/29/2018 Elsevier Patient Education  2020 [employer].

## 2020-09-22 NOTE — Progress Notes (Signed)
   Shanzay Hepworth Aug 07, 1961 [id number]   History:  59 y.o. G2P0022 presents for annual exam. 2003 TAH LSO for fibroid and benign cyst. No HRT. Taking Estroven for hot flashes with some improvement. Normal pap and mammogram history. Osteopenia of spine. Rheumatology manages fibromyalgia.   Gynecologic History No LMP recorded. Patient has had a hysterectomy.   Contraception: status post hysterectomy Last Pap: No longer screening per guidelines Last mammogram: 09/19/2020. Results were: normal Last colonoscopy: Never Last Dexa: 2017. Results were: t-score -1.3   Past medical history, past surgical history, family history and social history were all reviewed and documented in the EPIC chart.  ROS:  A ROS was performed and pertinent positives and negatives are included.  Exam:  Vitals:   09/22/20 1436  BP: 118/78  Weight: 126 lb (57.2 kg)  Height: 5\' 3"  (1.6 m)   Body mass index is 22.32 kg/m.  General appearance:  Normal Thyroid:  Symmetrical, normal in size, without palpable masses or nodularity. Respiratory  Auscultation:  Clear without wheezing or rhonchi Cardiovascular  Auscultation:  Regular rate, without rubs, murmurs or gallops  Edema/varicosities:  Not grossly evident Abdominal  Soft,nontender, without masses, guarding or rebound.  Liver/spleen:  No organomegaly noted  Hernia:  None appreciated  Skin  Inspection:  Grossly normal   Breasts: Examined lying and sitting.   Right: Without masses, retractions, discharge or axillary adenopathy.   Left: Without masses, retractions, discharge or axillary adenopathy. Gentitourinary   Inguinal/mons:  Normal without inguinal adenopathy  External genitalia:  Normal  BUS/Urethra/Skene's glands:  Normal  Vagina:  Normal  Cervix:  Absent  Uterus: Absent  Adnexa/parametria:     Rt: Without masses or tenderness.   Lt: Without masses or tenderness.  Anus and perineum: Normal  Digital rectal exam: Normal sphincter  tone without palpated masses or tenderness  Assessment/Plan:  59 y.o. [id number] for annual exam.   Well female exam with routine gynecological exam - Plan: CBC with Differential/Platelet, Comprehensive metabolic panel, TSH. Education provided on SBEs, importance of preventative screenings, current guidelines, high calcium diet, regular exercise, and multivitamin daily. Lipid panel does through husbands work  Osteopenia of spine - Plan: DG Bone Density - t-score -1.3 of spine Mother and grandmother with osteoporosis. Taking vitamin D supplement and beginning to reincorporate resistance training.   History of total abdominal hysterectomy - 2003 LSO. For fibroids and benign ovarian cyst. No HRT. Taking Estroven for hot flashes with some improvement.  Screening for osteoporosis - Plan: DG Bone Density  Screening for cervical cancer -normal Pap history.  No longer screening per guidelines.  Screening for breast cancer -normal mammogram history.  Continue annual screenings.  Normal breast exam today.  Screening for colon cancer -has not had screening colonoscopy.  Discussed current guidelines and importance of preventative screenings.  Information provided on [facility] and she plans to schedule this soon.  Follow-up in 1 year for annual.       Tamela Gammon [facility], 2:54 PM 09/22/2020

## 2020-09-23 LAB — COMPREHENSIVE METABOLIC PANEL
AG Ratio: 2.3 (calc) (ref 1.0–2.5)
ALT: 12 U/L (ref 6–29)
AST: 15 U/L (ref 10–35)
Albumin: 4.8 g/dL (ref 3.6–5.1)
Alkaline phosphatase (APISO): 89 U/L (ref 37–153)
BUN: 20 mg/dL (ref 7–25)
CO2: 28 mmol/L (ref 20–32)
Calcium: 9.9 mg/dL (ref 8.6–10.4)
Chloride: 103 mmol/L (ref 98–110)
Creat: 0.79 mg/dL (ref 0.50–1.05)
Globulin: 2.1 g/dL (calc) (ref 1.9–3.7)
Glucose, Bld: 104 mg/dL — ABNORMAL HIGH (ref 65–99)
Potassium: 4.9 mmol/L (ref 3.5–5.3)
Sodium: 139 mmol/L (ref 135–146)
Total Bilirubin: 0.3 mg/dL (ref 0.2–1.2)
Total Protein: 6.9 g/dL (ref 6.1–8.1)

## 2020-09-23 LAB — CBC WITH DIFFERENTIAL/PLATELET
Absolute Monocytes: 588 cells/uL (ref 200–950)
Basophils Absolute: 48 cells/uL (ref 0–200)
Basophils Relative: 0.9 %
Eosinophils Absolute: 111 cells/uL (ref 15–500)
Eosinophils Relative: 2.1 %
HCT: 43.9 % (ref 35.0–45.0)
Hemoglobin: 15 g/dL (ref 11.7–15.5)
Lymphs Abs: 1246 cells/uL (ref 850–3900)
MCH: 32.4 pg (ref 27.0–33.0)
MCHC: 34.2 g/dL (ref 32.0–36.0)
MCV: 94.8 fL (ref 80.0–100.0)
MPV: 10.5 fL (ref 7.5–12.5)
Monocytes Relative: 11.1 %
Neutro Abs: 3307 cells/uL (ref 1500–7800)
Neutrophils Relative %: 62.4 %
Platelets: 247 10*3/uL (ref 140–400)
RBC: 4.63 10*6/uL (ref 3.80–5.10)
RDW: 12.2 % (ref 11.0–15.0)
Total Lymphocyte: 23.5 %
WBC: 5.3 10*3/uL (ref 3.8–10.8)

## 2020-09-23 LAB — TSH: TSH: 1.98 mIU/L (ref 0.40–4.50)

## 2020-09-28 ENCOUNTER — Other Ambulatory Visit: Payer: Self-pay | Admitting: Nurse Practitioner

## 2020-09-28 ENCOUNTER — Other Ambulatory Visit: Payer: Self-pay

## 2020-09-28 ENCOUNTER — Ambulatory Visit (INDEPENDENT_AMBULATORY_CARE_PROVIDER_SITE_OTHER): Payer: 59

## 2020-09-28 DIAGNOSIS — M8589 Other specified disorders of bone density and structure, multiple sites: Secondary | ICD-10-CM | POA: Diagnosis not present

## 2020-09-28 DIAGNOSIS — M8588 Other specified disorders of bone density and structure, other site: Secondary | ICD-10-CM

## 2020-09-28 DIAGNOSIS — Z780 Asymptomatic menopausal state: Secondary | ICD-10-CM

## 2020-09-28 DIAGNOSIS — Z13820 Encounter for screening for osteoporosis: Secondary | ICD-10-CM

## 2020-10-14 ENCOUNTER — Other Ambulatory Visit: Payer: Self-pay | Admitting: Physician Assistant

## 2020-10-14 DIAGNOSIS — G8929 Other chronic pain: Secondary | ICD-10-CM

## 2020-10-17 NOTE — Telephone Encounter (Signed)
Last Visit:06/28/2020 Next Visit:01/03/2021 UDS:06/28/2020 c/w Narc Agreement:06/28/2020  Last Fill: 09/13/2020  Okay to refill Tramadol?

## 2020-10-19 ENCOUNTER — Telehealth: Payer: Self-pay

## 2020-10-19 ENCOUNTER — Other Ambulatory Visit: Payer: Self-pay | Admitting: *Deleted

## 2020-10-19 MED ORDER — ZOLPIDEM TARTRATE 5 MG PO TABS: 5.0000 mg | ORAL_TABLET | Freq: Every evening | ORAL | 2 refills | Status: DC | PRN

## 2020-10-19 NOTE — Telephone Encounter (Signed)
Attempted to contact patient and left message for patient to call the office.  

## 2020-10-19 NOTE — Telephone Encounter (Signed)
Patient advised we received the prescription refill today. Patient advised the prescription is in the doctor's box to be signed off. Patient expressed understanding.

## 2020-10-19 NOTE — Telephone Encounter (Signed)
Spoke with patient and she is in agreement to decrease Ambien to 5 mg nightly.

## 2020-10-19 NOTE — Telephone Encounter (Signed)
I recommend decreasing dose of Ambien to 5 mg p.o. daily.  The lower dose of Ambien has same efficacy and less side effect.  Please discussed with the patient.

## 2020-10-19 NOTE — Telephone Encounter (Signed)
Patient left a voicemail stating the pharmacy sent a fax to our office on Monday, 10/17/20 for prescription refill of Ambien and has not received a response.  Patient states she is out of medication and requesting the prescription be sent ASAP.

## 2020-10-19 NOTE — Telephone Encounter (Signed)
Refill request received via fax  Last Visit:06/28/2020 Next Visit:01/03/2021  Last Fill: 07/19/2020  Okay to refill Ambien?

## 2020-11-07 ENCOUNTER — Other Ambulatory Visit: Payer: Self-pay | Admitting: Rheumatology

## 2020-11-07 NOTE — Telephone Encounter (Signed)
Last Visit:06/28/2020 Next Visit:01/03/2021  Okay to refill per Dr. Estanislado Pandy

## 2020-11-16 ENCOUNTER — Other Ambulatory Visit: Payer: Self-pay | Admitting: Physician Assistant

## 2020-11-16 DIAGNOSIS — G8929 Other chronic pain: Secondary | ICD-10-CM

## 2020-11-16 NOTE — Telephone Encounter (Signed)
Last Visit:06/28/2020 Next Visit:01/03/2021 UDS:06/28/2020 c/w Narc Agreement:06/28/2020  Last Fill: 10/17/2020  Okay to refill Tramadol?

## 2020-12-20 NOTE — Progress Notes (Signed)
Office Visit Note  Patient: Desiree Randall             Date of Birth: 12-12-1960           MRN: [id number]             PCP: Glenis Smoker, MD Referring: No ref. provider found Visit Date: 01/03/2021 Occupation: @GUAROCC @  Subjective:  Right hip pain.   History of Present Illness: Desiree Randall is a 60 y.o. female with history of osteoarthritis and fibromyalgia syndrome. She states she continues to have some stiffness in her hands and knees. Recently she has been having pain and discomfort in her right hip which she points to her trochanteric region. She states she has difficulty sleeping on the right side. Her fibromyalgia is manageable with Cymbalta. She has discontinued tramadol. She is taking Ambien 5 mg at bedtime. She has been supplementing it with melatonin at times.  Activities of Daily Living:  Patient reports morning stiffness for 2-3 hours.    Patient Denies nocturnal pain.  Difficulty dressing/grooming: Denies Difficulty climbing stairs: Denies Difficulty getting out of chair: Denies Difficulty using hands for taps, buttons, cutlery, and/or writing: Reports  Review of Systems  Constitutional: Positive for fatigue.  HENT: Positive for nose dryness. Negative for mouth sores and mouth dryness.   Eyes: Positive for dryness. Negative for pain and itching.  Respiratory: Negative for shortness of breath and difficulty breathing.   Cardiovascular: Negative for chest pain and palpitations.  Gastrointestinal: Negative for blood in stool, constipation and diarrhea.  Endocrine: Negative for increased urination.  Genitourinary: Negative for difficulty urinating.  Musculoskeletal: Positive for arthralgias, joint pain, myalgias, morning stiffness, muscle tenderness and myalgias. Negative for joint swelling.  Skin: Negative for color change, rash and redness.  Allergic/Immunologic: Negative for susceptible to infections.  Neurological: Negative for  dizziness, numbness, headaches, memory loss and weakness.  Hematological: Negative for bruising/bleeding tendency.  Psychiatric/Behavioral: Negative for confusion.    PMFS History:  Patient Active Problem List   Diagnosis Date Noted  . Fibromyalgia 12/27/2016  . Other fatigue 12/27/2016  . Primary insomnia 12/27/2016  . Primary osteoarthritis of both hands 12/27/2016  . Primary osteoarthritis of both knees 12/27/2016    Past Medical History:  Diagnosis Date  . Fibromyalgia   . Osteoarthritis   . Osteopenia     Family History  Problem Relation Age of Onset  . Melanoma Mother   . Heart disease Mother   . Hypertension Mother   . Heart attack Mother   . Heart disease Father        TRIPLE BYPASS  . Hypercholesterolemia Father   . Heart attack Maternal Grandfather   . Stroke Maternal Grandfather   . Aneurysm Maternal Grandfather   . Heart failure Maternal Grandmother   . Heart attack Paternal Grandmother   . Heart disease Paternal Grandfather   . Kidney Stones Brother    Past Surgical History:  Procedure Laterality Date  . HYSTEROTOMY  2002  . KNEE SURGERY  1990-1991  . OVARY SURGERY  2003   LEFT OVARY REMOVAL  . toe joint replacement Bilateral 03/2018  . TUBAL LIGATION  2003   LEFT FALLOPIAN TUBE REMOVED   Social History   Social History Narrative  . Not on file   Immunization History  Administered Date(s) Administered  . PFIZER(Purple Top)SARS-COV-2 Vaccination 02/21/2020, 03/20/2020, 10/24/2020     Objective: Vital Signs: BP 137/82 (BP Location: Left Arm, Patient Position: Sitting, Cuff Size: Normal)  Pulse 83   Resp 14   Ht 5\' 3"  (1.6 m)   Wt 123 lb 9.6 oz (56.1 kg)   BMI 21.89 kg/m    Physical Exam Vitals and nursing note reviewed.  Constitutional:      Appearance: She is well-developed and well-nourished.  HENT:     Head: Normocephalic and atraumatic.  Eyes:     Extraocular Movements: EOM normal.     Conjunctiva/sclera: Conjunctivae normal.   Cardiovascular:     Rate and Rhythm: Normal rate and regular rhythm.     Pulses: Intact distal pulses.     Heart sounds: Normal heart sounds.  Pulmonary:     Effort: Pulmonary effort is normal.     Breath sounds: Normal breath sounds.  Abdominal:     General: Bowel sounds are normal.     Palpations: Abdomen is soft.  Musculoskeletal:     Cervical back: Normal range of motion.  Lymphadenopathy:     Cervical: No cervical adenopathy.  Skin:    General: Skin is warm and dry.     Capillary Refill: Capillary refill takes less than 2 seconds.  Neurological:     Mental Status: She is alert and oriented to person, place, and time.  Psychiatric:        Mood and Affect: Mood and affect normal.        Behavior: Behavior normal.      Musculoskeletal Exam: C-spine, thoracic and lumbar spine with good range of motion. Shoulder joints, elbow joints, wrist joints with good range of motion. She had bilateral PIP and DIP thickening. Hip joints and knee joints with good range of motion. She had no tenderness over MTPs. Right trochanteric tenderness was noted.  CDAI Exam: CDAI Score: - Patient Global: -; Provider Global: - Swollen: -; Tender: - Joint Exam 01/03/2021   No joint exam has been documented for this visit   There is currently no information documented on the homunculus. Go to the Rheumatology activity and complete the homunculus joint exam.  Investigation: No additional findings.  Imaging: No results found.  Recent Labs: Lab Results  Component Value Date   WBC 5.3 09/22/2020   HGB 15.0 09/22/2020   PLT 247 09/22/2020   NA 139 09/22/2020   K 4.9 09/22/2020   CL 103 09/22/2020   CO2 28 09/22/2020   GLUCOSE 104 (H) 09/22/2020   BUN 20 09/22/2020   CREATININE 0.79 09/22/2020   BILITOT 0.3 09/22/2020   AST 15 09/22/2020   ALT 12 09/22/2020   PROT 6.9 09/22/2020   CALCIUM 9.9 09/22/2020    Speciality Comments: No specialty comments available.  Procedures:  No  procedures performed Allergies: Patient has no known allergies.   Assessment / Plan:     Visit Diagnoses: Primary osteoarthritis of both hands-joint protection muscle strengthening was discussed.  Primary osteoarthritis of both knees-she currently does not have discomfort.  Primary osteoarthritis of both feet - Dr. 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 She has been using proper fitting shoes which has been helpful.  Trochanteric bursitis, right hip-she had tenderness over right trochanteric bursa. IT band stretches were demonstrated and a handout was given. I also offered a cortisone injection if her symptoms do not improve.  Fibromyalgia-she continues to have some generalized pain and discomfort. She has been taking Cymbalta which has been helpful.  Primary insomnia - Ambien 5 mg po at bedtime for insomnia. She had difficulty initially coming down from Ambien 10 mg to 5 mg. Now  she is tolerating it better. She is also supplementing with melatonin in between. Good sleep hygiene was discussed.  Other fatigue-related to fibromyalgia and insomnia.  Other chronic pain  Osteoporosis screening - DEXA 09/28/2020 T-score: -1.4. DEXA findings were discussed. Use of calcium, vitamin D and resistive exercises was discussed.  Orders: No orders of the defined types were placed in this encounter.  No orders of the defined types were placed in this encounter.    Follow-Up Instructions: Return in about 6 months (around 07/03/2021) for Osteoarthritis.   Bo Merino, MD  Note - This record has been created using Editor, commissioning.  Chart creation errors have been sought, but may not always  have been located. Such creation errors do not reflect on  the standard of medical care.

## 2021-01-03 ENCOUNTER — Other Ambulatory Visit: Payer: Self-pay

## 2021-01-03 ENCOUNTER — Encounter: Payer: Self-pay | Admitting: Rheumatology

## 2021-01-03 ENCOUNTER — Ambulatory Visit (INDEPENDENT_AMBULATORY_CARE_PROVIDER_SITE_OTHER): Payer: 59 | Admitting: Rheumatology

## 2021-01-03 VITALS — BP 137/82 | HR 83 | Resp 14 | Ht 63.0 in | Wt 123.6 lb

## 2021-01-03 DIAGNOSIS — M797 Fibromyalgia: Secondary | ICD-10-CM

## 2021-01-03 DIAGNOSIS — F5101 Primary insomnia: Secondary | ICD-10-CM

## 2021-01-03 DIAGNOSIS — M7061 Trochanteric bursitis, right hip: Secondary | ICD-10-CM

## 2021-01-03 DIAGNOSIS — M19071 Primary osteoarthritis, right ankle and foot: Secondary | ICD-10-CM

## 2021-01-03 DIAGNOSIS — M170 Bilateral primary osteoarthritis of knee: Secondary | ICD-10-CM

## 2021-01-03 DIAGNOSIS — M19041 Primary osteoarthritis, right hand: Secondary | ICD-10-CM | POA: Diagnosis not present

## 2021-01-03 DIAGNOSIS — M19072 Primary osteoarthritis, left ankle and foot: Secondary | ICD-10-CM

## 2021-01-03 DIAGNOSIS — G8929 Other chronic pain: Secondary | ICD-10-CM

## 2021-01-03 DIAGNOSIS — R5383 Other fatigue: Secondary | ICD-10-CM

## 2021-01-03 DIAGNOSIS — Z5181 Encounter for therapeutic drug level monitoring: Secondary | ICD-10-CM

## 2021-01-03 DIAGNOSIS — M19042 Primary osteoarthritis, left hand: Secondary | ICD-10-CM

## 2021-01-03 DIAGNOSIS — Z13820 Encounter for screening for osteoporosis: Secondary | ICD-10-CM

## 2021-01-03 NOTE — Patient Instructions (Signed)
Iliotibial Band Syndrome Rehab Ask your health care provider which exercises are safe for you. Do exercises exactly as told by your health care provider and adjust them as directed. It is normal to feel mild stretching, pulling, tightness, or discomfort as you do these exercises. Stop right away if you feel sudden pain or your pain gets significantly worse. Do not begin these exercises until told by your health care provider. Stretching and range-of-motion exercises These exercises warm up your muscles and joints and improve the movement and flexibility of your hip and pelvis. Quadriceps stretch, prone 1. Lie on your abdomen (prone position) on a firm surface, such as a bed or padded floor. 2. Bend your left / right knee and reach back to hold your ankle or pant leg. If you cannot reach your ankle or pant leg, loop a belt around your foot and grab the belt instead. 3. Gently pull your heel toward your buttocks. Your knee should not slide out to the side. You should feel a stretch in the front of your thigh and knee (quadriceps). 4. Hold this position for __________ seconds. Repeat __________ times. Complete this exercise __________ times a day.   Iliotibial band stretch An iliotibial band is a strong band of muscle tissue that runs from the outer side of your hip to the outer side of your thigh and knee. 1. Lie on your side with your left / right leg in the top position. 2. Bend both of your knees and grab your left / right ankle. Stretch out your bottom arm to help you balance. 3. Slowly bring your top knee back so your thigh goes behind your trunk. 4. Slowly lower your top leg toward the floor until you feel a gentle stretch on the outside of your left / right hip and thigh. If you do not feel a stretch and your knee will not fall farther, place the heel of your other foot on top of your knee and pull your knee down toward the floor with your foot. 5. Hold this position for __________  seconds. Repeat __________ times. Complete this exercise __________ times a day.   Strengthening exercises These exercises build strength and endurance in your hip and pelvis. Endurance is the ability to use your muscles for a long time, even after they get tired. Straight leg raises, side-lying This exercise strengthens the muscles that rotate the leg at the hip and move it away from your body (hip abductors). 1. Lie on your side with your left / right leg in the top position. Lie so your head, shoulder, hip, and knee line up. You may bend your bottom knee to help you balance. 2. Roll your hips slightly forward so your hips are stacked directly over each other and your left / right knee is facing forward. 3. Tense the muscles in your outer thigh and lift your top leg 4-6 inches (10-15 cm). 4. Hold this position for __________ seconds. 5. Slowly lower your leg to return to the starting position. Let your muscles relax completely before doing another repetition. Repeat __________ times. Complete this exercise __________ times a day.   Leg raises, prone This exercise strengthens the muscles that move the hips backward (hip extensors). 1. Lie on your abdomen (prone position) on your bed or a firm surface. You can put a pillow under your hips if that is more comfortable for your lower back. 2. Bend your left / right knee so your foot is straight up in the air.   3. Squeeze your buttocks muscles and lift your left / right thigh off the bed. Do not let your back arch. 4. Tense your thigh muscle as hard as you can without increasing any knee pain. 5. Hold this position for __________ seconds. 6. Slowly lower your leg to return to the starting position and allow it to relax completely. Repeat __________ times. Complete this exercise __________ times a day. Hip hike 1. Stand sideways on a bottom step. Stand on your left / right leg with your other foot unsupported next to the step. You can hold on to a  railing or wall for balance if needed. 2. Keep your knees straight and your torso square. Then lift your left / right hip up toward the ceiling. 3. Slowly let your left / right hip lower toward the floor, past the starting position. Your foot should get closer to the floor. Do not lean or bend your knees. Repeat __________ times. Complete this exercise __________ times a day. This information is not intended to replace advice given to you by your health care provider. Make sure you discuss any questions you have with your health care provider. Document Revised: 01/13/2020 Document Reviewed: 01/13/2020 Elsevier Patient Education  2021 [employer]  

## 2021-01-12 ENCOUNTER — Other Ambulatory Visit: Payer: Self-pay | Admitting: Rheumatology

## 2021-01-12 NOTE — Telephone Encounter (Signed)
Last Visit: 01/03/2021 Next Visit: 07/04/2021  Current Dose per office note on 01/03/2021: Ambien 5 mg po at bedtime for insomnia Dx: Primary insomnia   Last Fill: 10/19/2020  Okay to refill Ambien?

## 2021-02-02 ENCOUNTER — Other Ambulatory Visit: Payer: Self-pay | Admitting: Rheumatology

## 2021-02-02 NOTE — Telephone Encounter (Signed)
Next Visit: 07/04/2021  Last Visit: 01/03/2021  Last Fill: 11/07/2020  Dx: Fibromyalgia  Current Dose per office note on 01/03/2021, Cymbalta which has been helpful  Okay to refill Cymbalta?

## 2021-04-14 ENCOUNTER — Other Ambulatory Visit: Payer: Self-pay | Admitting: Rheumatology

## 2021-04-14 NOTE — Telephone Encounter (Signed)
Next Visit: 816/2022  Last Visit: 01/03/2021  Last Fill: 01/12/2021  Dx: Primary insomnia  Current Dose per office note on 01/03/2021, Ambien 5 mg po at bedtime for insomnia.   Okay to refill Ambien?

## 2021-05-03 ENCOUNTER — Other Ambulatory Visit: Payer: Self-pay | Admitting: *Deleted

## 2021-05-03 MED ORDER — DULOXETINE HCL 60 MG PO CPEP: 60.0000 mg | ORAL_CAPSULE | Freq: Every day | ORAL | 0 refills | Status: DC

## 2021-05-03 NOTE — Telephone Encounter (Signed)
Refill request received via fax  Next Visit: 07/04/2021   Last Visit: 01/03/2021   Last Fill: 02/02/2021   Dx: Fibromyalgia   Current Dose per office note on 01/03/2021, Cymbalta which has been helpful   Okay to refill Cymbalta?

## 2021-06-19 NOTE — Progress Notes (Deleted)
Office Visit Note  Patient: Desiree Randall             Date of Birth: 11/29/1960           MRN: [id number]             PCP: Glenis Smoker, MD Referring: Glenis Smoker, * Visit Date: 06/27/2021 Occupation: '@GUAROCC'$ @  Subjective:    History of Present Illness: Desiree Randall is a 60 y.o. female with history of fibromyalgia and osteoarthritis.  She is taking cymbalta 60 mg 1 capsule by mouth daily.  She takes ambien 5 mg at bedtime for insomnia.   Activities of Daily Living:  Patient reports morning stiffness for *** {minute/hour:19697}.   Patient {ACTIONS;DENIES/REPORTS:21021675::"Denies"} nocturnal pain.  Difficulty dressing/grooming: {ACTIONS;DENIES/REPORTS:21021675::"Denies"} Difficulty climbing stairs: {ACTIONS;DENIES/REPORTS:21021675::"Denies"} Difficulty getting out of chair: {ACTIONS;DENIES/REPORTS:21021675::"Denies"} Difficulty using hands for taps, buttons, cutlery, and/or writing: {ACTIONS;DENIES/REPORTS:21021675::"Denies"}  No Rheumatology ROS completed.   PMFS History:  Patient Active Problem List   Diagnosis Date Noted   Fibromyalgia 12/27/2016   Other fatigue 12/27/2016   Primary insomnia 12/27/2016   Primary osteoarthritis of both hands 12/27/2016   Primary osteoarthritis of both knees 12/27/2016    Past Medical History:  Diagnosis Date   Fibromyalgia    Osteoarthritis    Osteopenia     Family History  Problem Relation Age of Onset   Melanoma Mother    Heart disease Mother    Hypertension Mother    Heart attack Mother    Heart disease Father        TRIPLE BYPASS   Hypercholesterolemia Father    Heart attack Maternal Grandfather    Stroke Maternal Grandfather    Aneurysm Maternal Grandfather    Heart failure Maternal Grandmother    Heart attack Paternal Grandmother    Heart disease Paternal Grandfather    Kidney Stones Brother    Past Surgical History:  Procedure Laterality Date   HYSTEROTOMY  2002   KNEE  SURGERY  1990-1991   OVARY SURGERY  2003   LEFT OVARY REMOVAL   toe joint replacement Bilateral 03/2018   TUBAL LIGATION  2003   LEFT FALLOPIAN TUBE REMOVED   Social History   Social History Narrative   Not on file   Immunization History  Administered Date(s) Administered   PFIZER(Purple Top)SARS-COV-2 Vaccination 02/21/2020, 03/20/2020, 10/24/2020     Objective: Vital Signs: There were no vitals taken for this visit.   Physical Exam Vitals and nursing note reviewed.  Constitutional:      Appearance: She is well-developed.  HENT:     Head: Normocephalic and atraumatic.  Eyes:     Conjunctiva/sclera: Conjunctivae normal.  Pulmonary:     Effort: Pulmonary effort is normal.  Abdominal:     Palpations: Abdomen is soft.  Musculoskeletal:     Cervical back: Normal range of motion.  Skin:    General: Skin is warm and dry.     Capillary Refill: Capillary refill takes less than 2 seconds.  Neurological:     Mental Status: She is alert and oriented to person, place, and time.  Psychiatric:        Behavior: Behavior normal.     Musculoskeletal Exam: ***  CDAI Exam: CDAI Score: -- Patient Global: --; Provider Global: -- Swollen: --; Tender: -- Joint Exam 06/27/2021   No joint exam has been documented for this visit   There is currently no information documented on the homunculus. Go to the Rheumatology activity and complete the homunculus  joint exam.  Investigation: No additional findings.  Imaging: No results found.  Recent Labs: Lab Results  Component Value Date   WBC 5.3 09/22/2020   HGB 15.0 09/22/2020   PLT 247 09/22/2020   NA 139 09/22/2020   K 4.9 09/22/2020   CL 103 09/22/2020   CO2 28 09/22/2020   GLUCOSE 104 (H) 09/22/2020   BUN 20 09/22/2020   CREATININE 0.79 09/22/2020   BILITOT 0.3 09/22/2020   AST 15 09/22/2020   ALT 12 09/22/2020   PROT 6.9 09/22/2020   CALCIUM 9.9 09/22/2020    Speciality Comments: No specialty comments  available.  Procedures:  No procedures performed Allergies: Patient has no known allergies.   Assessment / Plan:     Visit Diagnoses: Primary osteoarthritis of both hands  Primary osteoarthritis of both knees  Primary osteoarthritis of both feet - Dr. 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   Trochanteric bursitis, right hip  Fibromyalgia  Other fatigue  Primary insomnia - Ambien 5 mg po at bedtime for insomnia.   Other chronic pain  Osteoporosis screening - DEXA 09/28/2020 T-score: -1.4.  Orders: No orders of the defined types were placed in this encounter.  No orders of the defined types were placed in this encounter.     Follow-Up Instructions: No follow-ups on file.   Ofilia Neas, PA-C  Note - This record has been created using Dragon software.  Chart creation errors have been sought, but may not always  have been located. Such creation errors do not reflect on  the standard of medical care.

## 2021-06-27 ENCOUNTER — Ambulatory Visit: Payer: 59 | Admitting: Physician Assistant

## 2021-06-27 DIAGNOSIS — M19071 Primary osteoarthritis, right ankle and foot: Secondary | ICD-10-CM

## 2021-06-27 DIAGNOSIS — M19042 Primary osteoarthritis, left hand: Secondary | ICD-10-CM

## 2021-06-27 DIAGNOSIS — G8929 Other chronic pain: Secondary | ICD-10-CM

## 2021-06-27 DIAGNOSIS — R5383 Other fatigue: Secondary | ICD-10-CM

## 2021-06-27 DIAGNOSIS — F5101 Primary insomnia: Secondary | ICD-10-CM

## 2021-06-27 DIAGNOSIS — Z13820 Encounter for screening for osteoporosis: Secondary | ICD-10-CM

## 2021-06-27 DIAGNOSIS — M170 Bilateral primary osteoarthritis of knee: Secondary | ICD-10-CM

## 2021-06-27 DIAGNOSIS — M7061 Trochanteric bursitis, right hip: Secondary | ICD-10-CM

## 2021-06-27 DIAGNOSIS — M797 Fibromyalgia: Secondary | ICD-10-CM

## 2021-07-04 ENCOUNTER — Ambulatory Visit: Payer: 59 | Admitting: Physician Assistant

## 2021-07-13 ENCOUNTER — Other Ambulatory Visit: Payer: Self-pay | Admitting: *Deleted

## 2021-07-13 MED ORDER — ZOLPIDEM TARTRATE 5 MG PO TABS: ORAL_TABLET | ORAL | 2 refills | Status: DC

## 2021-07-13 NOTE — Telephone Encounter (Signed)
Refill request received via fax   Next Visit: 07/19/2021   Last Visit: 01/03/2021   Last Fill: 04/14/2021   Dx: Primary insomnia   Current Dose per office note on 01/03/2021, Ambien 5 mg po at bedtime for insomnia.    Okay to refill Ambien?

## 2021-07-14 NOTE — Progress Notes (Signed)
Office Visit Note  Patient: Desiree Randall             Date of Birth: 1961/06/19           MRN: [id number]             PCP: Glenis Smoker, MD Referring: Glenis Smoker, * Visit Date: 07/19/2021 Occupation: '@GUAROCC'$ @  Subjective:  Insomnia   History of Present Illness: Desiree Randall is a 60 y.o. female with history of osteoarthritis and fibromyalgia.  Patient reports that in April she was working in her yardand patient a wheelbarrow full of firewood over uneven terrain with roots.  She states that she started to have pain in her right hamstrings while working in the yard.  Later she found a large bruise in the posterior aspect of her right thigh.  She scheduled an appointment with Dr. Tonita Cong and has been being treated for a torn hamstring.  She went through 6 weeks of physical therapy and tried dry needling.  She also tried a cortisone injection as well as a prednisone taper.  She continues to await approval for an MRI.  She states that she has been trying to walk 3 miles 3 days a week but has had to back off due to increased pain she has been experiencing.  She states that with the gait change she has had some increased pain in the right trochanteric bursa and right knee.  She uses Voltaren gel topically as needed for pain relief.  She denies any joint swelling at this time.  She states that overall her fibromyalgia pain has been tolerable.  She remains on Cymbalta 60 mg 1 capsule by mouth daily.  She takes Ambien 5 mg at bedtime for insomnia.  She is not having significant difficulty sleeping at night due to nocturnal pain.  Prior to the right hamstring tear she tried reducing the dose of Ambien from 10 mg to 5 mg at bedtime but she has only been sleeping 2 to 3 hours and occasionally up to 5 hours per night.      Activities of Daily Living:  Patient reports morning stiffness for 2-3 hours.   Patient Reports nocturnal pain.  Difficulty dressing/grooming:  Denies Difficulty climbing stairs: Reports Difficulty getting out of chair: Denies Difficulty using hands for taps, buttons, cutlery, and/or writing: Reports  Review of Systems  Constitutional:  Positive for fatigue.  HENT:  Negative for mouth sores, mouth dryness and nose dryness.   Eyes:  Negative for pain, itching and dryness.  Respiratory:  Negative for shortness of breath and difficulty breathing.   Cardiovascular:  Negative for chest pain and palpitations.  Gastrointestinal:  Negative for blood in stool, constipation and diarrhea.  Endocrine: Negative for increased urination.  Genitourinary:  Negative for difficulty urinating.  Musculoskeletal:  Positive for joint pain, joint pain, joint swelling, myalgias, morning stiffness, muscle tenderness and myalgias.  Skin:  Negative for color change, rash and redness.  Allergic/Immunologic: Negative for susceptible to infections.  Neurological:  Negative for dizziness, numbness, headaches, memory loss and weakness.  Hematological:  Negative for bruising/bleeding tendency.  Psychiatric/Behavioral:  Negative for confusion.    PMFS History:  Patient Active Problem List   Diagnosis Date Noted   Fibromyalgia 12/27/2016   Other fatigue 12/27/2016   Primary insomnia 12/27/2016   Primary osteoarthritis of both hands 12/27/2016   Primary osteoarthritis of both knees 12/27/2016    Past Medical History:  Diagnosis Date   Fibromyalgia  Osteoarthritis    Osteopenia     Family History  Problem Relation Age of Onset   Melanoma Mother    Heart disease Mother    Hypertension Mother    Heart attack Mother    Heart disease Father        TRIPLE BYPASS   Hypercholesterolemia Father    Heart attack Maternal Grandfather    Stroke Maternal Grandfather    Aneurysm Maternal Grandfather    Heart failure Maternal Grandmother    Heart attack Paternal Grandmother    Heart disease Paternal Grandfather    Kidney Stones Brother    Past Surgical  History:  Procedure Laterality Date   HYSTEROTOMY  2002   KNEE SURGERY  1990-1991   OVARY SURGERY  2003   LEFT OVARY REMOVAL   toe joint replacement Bilateral 03/2018   TUBAL LIGATION  2003   LEFT FALLOPIAN TUBE REMOVED   Social History   Social History Narrative   Not on file   Immunization History  Administered Date(s) Administered   PFIZER(Purple Top)SARS-COV-2 Vaccination 02/21/2020, 03/20/2020, 10/24/2020     Objective: Vital Signs: BP (!) 150/85 (BP Location: Left Arm, Patient Position: Sitting, Cuff Size: Small)   Pulse 85   Resp 12   Ht '5\' 3"'$  (1.6 m)   Wt 121 lb 12.8 oz (55.2 kg)   BMI 21.58 kg/m    Physical Exam Vitals and nursing note reviewed.  Constitutional:      Appearance: She is well-developed.  HENT:     Head: Normocephalic and atraumatic.  Eyes:     Conjunctiva/sclera: Conjunctivae normal.  Pulmonary:     Effort: Pulmonary effort is normal.  Abdominal:     Palpations: Abdomen is soft.  Musculoskeletal:     Cervical back: Normal range of motion.  Skin:    General: Skin is warm and dry.     Capillary Refill: Capillary refill takes less than 2 seconds.  Neurological:     Mental Status: She is alert and oriented to person, place, and time.  Psychiatric:        Behavior: Behavior normal.     Musculoskeletal Exam: C-spine, thoracic spine, and lumbar spine have good range of motion with no discomfort.  Shoulder joints, elbow joints, wrist joints, MCPs, PIPs, DIPs have good range of motion with no synovitis.  PIP and DIP prominence consistent with osteoarthritis of both hands.  Complete fist formation bilaterally.  Right hip has limited range of motion.  Tenderness over the right trochanteric bursa.  Knee joints have good range of motion with no warmth or effusion.  Ankle joints have good range of motion with no tenderness or inflammation.  CDAI Exam: CDAI Score: -- Patient Global: --; Provider Global: -- Swollen: --; Tender: -- Joint Exam  07/19/2021   No joint exam has been documented for this visit   There is currently no information documented on the homunculus. Go to the Rheumatology activity and complete the homunculus joint exam.  Investigation: No additional findings.  Imaging: No results found.  Recent Labs: Lab Results  Component Value Date   WBC 5.3 09/22/2020   HGB 15.0 09/22/2020   PLT 247 09/22/2020   NA 139 09/22/2020   K 4.9 09/22/2020   CL 103 09/22/2020   CO2 28 09/22/2020   GLUCOSE 104 (H) 09/22/2020   BUN 20 09/22/2020   CREATININE 0.79 09/22/2020   BILITOT 0.3 09/22/2020   AST 15 09/22/2020   ALT 12 09/22/2020   PROT 6.9 09/22/2020  CALCIUM 9.9 09/22/2020    Speciality Comments: No specialty comments available.  Procedures:  No procedures performed Allergies: Patient has no known allergies.   Assessment / Plan:     Visit Diagnoses: Primary osteoarthritis of both hands: She has PIP and DIP thickening consistent with osteoarthritis of both hands.  No tenderness or inflammation was noted.  Complete fist formation bilaterally.  Discussed the importance of joint protection and muscle strengthening.  She was advised to notify us if she develops increased joint pain or joint swelling.  She will follow-up in the office in 6 months.  Primary osteoarthritis of both knees: She has good range of motion of both knee joints with no discomfort.  No warmth or effusion of knee joints noted.  Primary osteoarthritis of both feet - Dr. 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   Trochanteric bursitis, right hip: She is currently having pain on the lateral aspect of her right hip consistent with trochanter bursitis.  She tore her right hamstring in April 2022 and has been following up with her orthopedist on a regular basis.  She went to physical therapy for 6 weeks with minimal improvement in her symptoms.  Due to the gait changes after her injury she has been having increased pain secondary to  trochanter bursitis of the right hip.  She tried dry needling which alleviated her discomfort temporarily.  She is unable to perform her normal stretching exercises due to the severity of pain and limited range of motion in her right hip.  She is currently awaiting an MRI and plans on then following back up with her orthopedist.  Fibromyalgia: She has not had any recent fibromyalgia flares.  Overall her symptoms have been tolerable.  Earlier this spring she was starting to increase her activity level as tolerated and even started playing pickle ball for about 2 weeks prior to tearing her right hamstring.  She has had to be slightly more sedentary since the injury but has completed 6 weeks of physical therapy.  She has been trying to walk 3 miles 3 days a week but had to take this week off due to experiencing increased pain in her right leg.  Since her gait has changed with the injury she has been having increased discomfort due to trochanter bursitis of the right hip.  She is unable to perform her normal exercises due to the limited range of motion in her hip.  She has been using Voltaren gel topically as needed for pain relief.  She has been experiencing interrupted sleep at night due to the increased nocturnal pain.  Prior to her injury she had tried to reduce the dose of Ambien from 10 mg to 5 mg at bedtime.  She has been having significant difficulty sleeping at night with the reduced dose of Ambien.  The prescription for Ambien will be changed to 5 mg 1 to 2 tablets at bedtime for insomnia.  Other fatigue: Chronic and secondary to insomnia.  We discussed the importance of regular exercise and good sleep hygiene.  Primary insomnia - She has been having difficulty sleeping at night since reducing the dose of Ambien from 10 mg to 5 mg at bedtime.  Some nights she only sleeps 2 to 3 hours and a max of 5 hours per night.  She has been having interrupted sleep at night due to increased nocturnal pain since her  right hamstring tear.  Her prescription for Ambien will be changed to 5 mg 1 to 2  tablets at bedtime as needed for insomnia.  She will try to only take 5 mg on a nightly basis but if she needs to take 2 tablets she can.  Discussed the importance of good sleep hygiene.  Other chronic pain: She is no longer taking tramadol.  Osteoporosis screening - DEXA 09/28/2020 T-score: -1.4.  Orders: No orders of the defined types were placed in this encounter.  No orders of the defined types were placed in this encounter.     Follow-Up Instructions: Return in about 6 months (around 01/16/2022) for Osteoarthritis, Fibromyalgia.   Ofilia Neas, PA-C  Note - This record has been created using Dragon software.  Chart creation errors have been sought, but may not always  have been located. Such creation errors do not reflect on  the standard of medical care.

## 2021-07-19 ENCOUNTER — Encounter: Payer: Self-pay | Admitting: Physician Assistant

## 2021-07-19 ENCOUNTER — Other Ambulatory Visit: Payer: Self-pay

## 2021-07-19 ENCOUNTER — Ambulatory Visit (INDEPENDENT_AMBULATORY_CARE_PROVIDER_SITE_OTHER): Payer: 59 | Admitting: Physician Assistant

## 2021-07-19 VITALS — BP 150/85 | HR 85 | Resp 12 | Ht 63.0 in | Wt 121.8 lb

## 2021-07-19 DIAGNOSIS — M19042 Primary osteoarthritis, left hand: Secondary | ICD-10-CM

## 2021-07-19 DIAGNOSIS — M19041 Primary osteoarthritis, right hand: Secondary | ICD-10-CM | POA: Diagnosis not present

## 2021-07-19 DIAGNOSIS — M19072 Primary osteoarthritis, left ankle and foot: Secondary | ICD-10-CM

## 2021-07-19 DIAGNOSIS — M7061 Trochanteric bursitis, right hip: Secondary | ICD-10-CM

## 2021-07-19 DIAGNOSIS — R5383 Other fatigue: Secondary | ICD-10-CM

## 2021-07-19 DIAGNOSIS — M170 Bilateral primary osteoarthritis of knee: Secondary | ICD-10-CM

## 2021-07-19 DIAGNOSIS — M19071 Primary osteoarthritis, right ankle and foot: Secondary | ICD-10-CM | POA: Diagnosis not present

## 2021-07-19 DIAGNOSIS — F5101 Primary insomnia: Secondary | ICD-10-CM

## 2021-07-19 DIAGNOSIS — G8929 Other chronic pain: Secondary | ICD-10-CM

## 2021-07-19 DIAGNOSIS — M797 Fibromyalgia: Secondary | ICD-10-CM

## 2021-07-19 DIAGNOSIS — Z13820 Encounter for screening for osteoporosis: Secondary | ICD-10-CM

## 2021-07-19 MED ORDER — ZOLPIDEM TARTRATE 5 MG PO TABS: ORAL_TABLET | ORAL | 2 refills | Status: DC

## 2021-07-19 NOTE — Telephone Encounter (Signed)
Please review and sign "no print" prescription for Ambien dose change you advised today at patient's appointment. Thanks!

## 2021-08-03 ENCOUNTER — Other Ambulatory Visit: Payer: Self-pay | Admitting: Physician Assistant

## 2021-08-03 NOTE — Telephone Encounter (Signed)
Next Visit: 01/16/2021  Last Visit: 07/19/2021  Last Fill: 05/03/2021  Dx: Fibromyalgia  Current Dose per office note on 07/19/2021: not discussed  Okay to refill Cymbalta?

## 2021-08-10 ENCOUNTER — Other Ambulatory Visit: Payer: Self-pay

## 2021-08-10 MED ORDER — ZOLPIDEM TARTRATE 5 MG PO TABS: ORAL_TABLET | ORAL | 2 refills | Status: DC

## 2021-08-10 NOTE — Telephone Encounter (Signed)
Spoke with Desiree Randall at [facility] and confirmed Ambien dose has increased. She states they will need a new prescription sent to the pharmacy.  Next Visit: 01/16/2021  Last Visit: 07/19/2021  Dx: insomnia.  Current Dose per office note on 07/19/2021: Ambien will be changed to 5 mg 1 to 2 tablets at bedtime for insomnia.  Okay to refill Ambien?

## 2021-08-10 NOTE — Telephone Encounter (Signed)
[facility] called to confirm early refill on Zolpidem.  Pharmacist states the patient requested to pick up her prescription today and that her dosage was increased.  Please call back at [phone]

## 2021-11-01 ENCOUNTER — Other Ambulatory Visit: Payer: Self-pay | Admitting: Physician Assistant

## 2021-11-01 NOTE — Telephone Encounter (Signed)
Next Visit: 01/16/2021  Last Visit: 07/19/2021  Last Fill: 08/03/2021  Dx: Fibromyalgia  Current Dose per office note on 07/19/2021: not discussed  Okay to refill Cymbalta?

## 2021-11-14 ENCOUNTER — Other Ambulatory Visit: Payer: Self-pay | Admitting: Physician Assistant

## 2021-11-14 NOTE — Telephone Encounter (Signed)
Next Visit: 01/16/2021   Last Visit: 07/19/2021   Dx: insomnia.   Current Dose per office note on 07/19/2021: Ambien will be changed to 5 mg 1 to 2 tablets at bedtime for insomnia.   Last Fill: 08/10/2021  Okay to refill Ambien?

## 2022-01-02 NOTE — Progress Notes (Unsigned)
Office Visit Note  Patient: Desiree Randall             Date of Birth: [birth year]           MRN: [id number]             PCP: Glenis Smoker, MD Referring: Glenis Smoker, * Visit Date: 01/16/2022 Occupation: @GUAROCC @  Subjective:  No chief complaint on file.   History of Present Illness: Desiree Randall is a 61 y.o. female ***   Activities of Daily Living:  Patient reports morning stiffness for *** {minute/hour:19697}.   Patient {ACTIONS;DENIES/REPORTS:21021675::"Denies"} nocturnal pain.  Difficulty dressing/grooming: {ACTIONS;DENIES/REPORTS:21021675::"Denies"} Difficulty climbing stairs: {ACTIONS;DENIES/REPORTS:21021675::"Denies"} Difficulty getting out of chair: {ACTIONS;DENIES/REPORTS:21021675::"Denies"} Difficulty using hands for taps, buttons, cutlery, and/or writing: {ACTIONS;DENIES/REPORTS:21021675::"Denies"}  No Rheumatology ROS completed.   PMFS History:  Patient Active Problem List   Diagnosis Date Noted   Fibromyalgia 12/27/2016   Other fatigue 12/27/2016   Primary insomnia 12/27/2016   Primary osteoarthritis of both hands 12/27/2016   Primary osteoarthritis of both knees 12/27/2016    Past Medical History:  Diagnosis Date   Fibromyalgia    Osteoarthritis    Osteopenia     Family History  Problem Relation Age of Onset   Melanoma Mother    Heart disease Mother    Hypertension Mother    Heart attack Mother    Heart disease Father        TRIPLE BYPASS   Hypercholesterolemia Father    Heart attack Maternal Grandfather    Stroke Maternal Grandfather    Aneurysm Maternal Grandfather    Heart failure Maternal Grandmother    Heart attack Paternal Grandmother    Heart disease Paternal Grandfather    Kidney Stones Brother    Past Surgical History:  Procedure Laterality Date   HYSTEROTOMY  2002   KNEE SURGERY  1990-1991   OVARY SURGERY  2003   LEFT OVARY REMOVAL   toe joint replacement Bilateral 03/2018   TUBAL  LIGATION  2003   LEFT FALLOPIAN TUBE REMOVED   Social History   Social History Narrative   Not on file   Immunization History  Administered Date(s) Administered   PFIZER(Purple Top)SARS-COV-2 Vaccination 02/21/2020, 03/20/2020, 10/24/2020     Objective: Vital Signs: There were no vitals taken for this visit.   Physical Exam   Musculoskeletal Exam: ***  CDAI Exam: CDAI Score: -- Patient Global: --; Provider Global: -- Swollen: --; Tender: -- Joint Exam 01/16/2022   No joint exam has been documented for this visit   There is currently no information documented on the homunculus. Go to the Rheumatology activity and complete the homunculus joint exam.  Investigation: No additional findings.  Imaging: No results found.  Recent Labs: Lab Results  Component Value Date   WBC 5.3 09/22/2020   HGB 15.0 09/22/2020   PLT 247 09/22/2020   NA 139 09/22/2020   K 4.9 09/22/2020   CL 103 09/22/2020   CO2 28 09/22/2020   GLUCOSE 104 (H) 09/22/2020   BUN 20 09/22/2020   CREATININE 0.79 09/22/2020   BILITOT 0.3 09/22/2020   AST 15 09/22/2020   ALT 12 09/22/2020   PROT 6.9 09/22/2020   CALCIUM 9.9 09/22/2020    Speciality Comments: No specialty comments available.  Procedures:  No procedures performed Allergies: Patient has no known allergies.   Assessment / Plan:     Visit Diagnoses: No diagnosis found.  Orders: No orders of the defined types were placed in this encounter.  No orders of the defined types were placed in this encounter.   Face-to-face time spent with patient was *** minutes. Greater than 50% of time was spent in counseling and coordination of care.  Follow-Up Instructions: No follow-ups on file.   Earnestine Mealing, CMA  Note - This record has been created using Editor, commissioning.  Chart creation errors have been sought, but may not always  have been located. Such creation errors do not reflect on  the standard of medical care.

## 2022-01-16 ENCOUNTER — Ambulatory Visit: Payer: 59 | Admitting: Rheumatology

## 2022-01-16 DIAGNOSIS — M7061 Trochanteric bursitis, right hip: Secondary | ICD-10-CM

## 2022-01-16 DIAGNOSIS — M19071 Primary osteoarthritis, right ankle and foot: Secondary | ICD-10-CM

## 2022-01-16 DIAGNOSIS — M797 Fibromyalgia: Secondary | ICD-10-CM

## 2022-01-16 DIAGNOSIS — R5383 Other fatigue: Secondary | ICD-10-CM

## 2022-01-16 DIAGNOSIS — M19041 Primary osteoarthritis, right hand: Secondary | ICD-10-CM

## 2022-01-16 DIAGNOSIS — M170 Bilateral primary osteoarthritis of knee: Secondary | ICD-10-CM

## 2022-01-16 DIAGNOSIS — G8929 Other chronic pain: Secondary | ICD-10-CM

## 2022-01-16 DIAGNOSIS — F5101 Primary insomnia: Secondary | ICD-10-CM

## 2022-01-16 DIAGNOSIS — Z13820 Encounter for screening for osteoporosis: Secondary | ICD-10-CM

## 2022-01-17 ENCOUNTER — Other Ambulatory Visit: Payer: Self-pay | Admitting: Nurse Practitioner

## 2022-01-17 DIAGNOSIS — Z1231 Encounter for screening mammogram for malignant neoplasm of breast: Secondary | ICD-10-CM

## 2022-01-26 ENCOUNTER — Ambulatory Visit
Admission: RE | Admit: 2022-01-26 | Discharge: 2022-01-26 | Disposition: A | Discharge status: Home / Self Care | Payer: 59 | Source: Ambulatory Visit | Attending: Nurse Practitioner | Admitting: Nurse Practitioner

## 2022-01-26 ENCOUNTER — Ambulatory Visit: Payer: 59

## 2022-01-26 DIAGNOSIS — Z1231 Encounter for screening mammogram for malignant neoplasm of breast: Secondary | ICD-10-CM

## 2022-01-28 ENCOUNTER — Other Ambulatory Visit: Payer: Self-pay | Admitting: Physician Assistant

## 2022-01-29 NOTE — Telephone Encounter (Signed)
Next Visit: 01/16/2021 ?  ?Last Visit: 07/19/2021 ?  ?Last Fill: 11/01/2021 ?  ?Dx: Fibromyalgia ?  ?Current Dose per office note on 07/19/2021: not discussed ?  ?Okay to refill Cymbalta?   ?

## 2022-02-01 NOTE — Progress Notes (Signed)
Office Visit Note  Patient: Desiree Randall             Date of Birth: 08-20-1961           MRN: [id number]             PCP: Shon Hale, MD Referring: Shon Hale, * Visit Date: 02/14/2022 Occupation: @GUAROCC @  Subjective:  No chief complaint on file.   History of Present Illness: Desiree Randall is a 61 y.o. female with history of osteoarthritis.  She states in the spring 2022 she injured her right hip.  She was evaluated by Dr. Jillyn Hidden who diagnosed her with gluteus medius and gluteus minimus rupture.  She has been under care of Dr. Aundria Rud now.  She will be getting PRP injection and repeat MRI.  She continues to have pain and discomfort.  She states she has nocturnal pain due to discomfort.  She has been taking Ambien 5 mg 2 tablets at nighttime to sleep.  She can history of discomfort in her bilateral hands, knee joints and her feet due to underlying osteoarthritis.  She has off-and-on discomfort in the right trochanteric bursa.  She continues to have discomfort from fibromyalgia.  Activities of Daily Living:  Patient reports morning stiffness for several hours.   Patient Denies nocturnal pain.  Difficulty dressing/grooming: Denies Difficulty climbing stairs: Reports Difficulty getting out of chair: Denies Difficulty using hands for taps, buttons, cutlery, and/or writing: Reports  Review of Systems  Constitutional:  Positive for fatigue.  HENT:  Positive for nose dryness. Negative for mouth sores and mouth dryness.   Eyes:  Positive for dryness. Negative for pain and itching.  Respiratory:  Negative for shortness of breath and difficulty breathing.   Cardiovascular:  Negative for chest pain and palpitations.  Gastrointestinal:  Negative for blood in stool, constipation and diarrhea.  Endocrine: Negative for increased urination.  Genitourinary:  Negative for difficulty urinating.  Musculoskeletal:  Positive for joint pain, joint pain, joint  swelling, myalgias, morning stiffness, muscle tenderness and myalgias.  Skin:  Negative for color change, rash and redness.  Allergic/Immunologic: Negative for susceptible to infections.  Neurological:  Negative for dizziness, numbness, headaches and memory loss.  Hematological:  Positive for bruising/bleeding tendency.  Psychiatric/Behavioral:  Negative for confusion.    PMFS History:  Patient Active Problem List   Diagnosis Date Noted   Osteopenia of multiple sites 02/14/2022   Primary osteoarthritis of both feet 02/14/2022   Fibromyalgia 12/27/2016   Other fatigue 12/27/2016   Primary insomnia 12/27/2016   Primary osteoarthritis of both hands 12/27/2016   Primary osteoarthritis of both knees 12/27/2016    Past Medical History:  Diagnosis Date   Fibromyalgia    Osteoarthritis    Osteopenia     Family History  Problem Relation Age of Onset   Melanoma Mother    Heart disease Mother    Hypertension Mother    Heart attack Mother    Heart disease Father        TRIPLE BYPASS   Hypercholesterolemia Father    Heart failure Maternal Grandmother    Heart attack Maternal Grandfather    Stroke Maternal Grandfather    Aneurysm Maternal Grandfather    Heart attack Paternal Grandmother    Heart disease Paternal Grandfather    Kidney Stones Brother    Breast cancer Neg Hx    Past Surgical History:  Procedure Laterality Date   HYSTEROTOMY  2002   KNEE SURGERY  [phone]  OVARY SURGERY  2003   LEFT OVARY REMOVAL   toe joint replacement Bilateral 03/2018   TUBAL LIGATION  2003   LEFT FALLOPIAN TUBE REMOVED   Social History   Social History Narrative   Not on file   Immunization History  Administered Date(s) Administered   PFIZER(Purple Top)SARS-COV-2 Vaccination 02/21/2020, 03/20/2020, 10/24/2020     Objective: Vital Signs: BP (!) 161/90 (BP Location: Left Arm, Patient Position: Sitting, Cuff Size: Normal)   Pulse 76   Ht 5\' 3"  (1.6 m)   Wt 125 lb 6.4 oz (56.9 kg)    BMI 22.21 kg/m    Physical Exam Vitals and nursing note reviewed.  Constitutional:      Appearance: She is well-developed.  HENT:     Head: Normocephalic and atraumatic.  Eyes:     Conjunctiva/sclera: Conjunctivae normal.  Cardiovascular:     Rate and Rhythm: Normal rate and regular rhythm.     Heart sounds: Normal heart sounds.  Pulmonary:     Effort: Pulmonary effort is normal.     Breath sounds: Normal breath sounds.  Abdominal:     General: Bowel sounds are normal.     Palpations: Abdomen is soft.  Musculoskeletal:     Cervical back: Normal range of motion.  Lymphadenopathy:     Cervical: No cervical adenopathy.  Skin:    General: Skin is warm and dry.     Capillary Refill: Capillary refill takes less than 2 seconds.  Neurological:     Mental Status: She is alert and oriented to person, place, and time.  Psychiatric:        Behavior: Behavior normal.     Musculoskeletal Exam: C-spine was in good range of motion.  Shoulder joints, elbow joints, wrist joints with good range of motion.  She had bilateral PIP and DIP thickening.  Hip joints and knee joints with good range of motion.  She had discomfort on palpation over her right trochanteric bursa and right gluteal region.  There was no warmth swelling or effusion in the knee joints.  There was no tenderness over ankles or MTPs.  CDAI Exam: CDAI Score: -- Patient Global: --; Provider Global: -- Swollen: --; Tender: -- Joint Exam 02/14/2022   No joint exam has been documented for this visit   There is currently no information documented on the homunculus. Go to the Rheumatology activity and complete the homunculus joint exam.  Investigation: No additional findings.  Imaging: MM 3D SCREEN BREAST BILATERAL  Result Date: 01/26/2022 CLINICAL DATA:  Screening. EXAM: DIGITAL SCREENING BILATERAL MAMMOGRAM WITH TOMOSYNTHESIS AND CAD TECHNIQUE: Bilateral screening digital craniocaudal and mediolateral oblique mammograms were  obtained. Bilateral screening digital breast tomosynthesis was performed. The images were evaluated with computer-aided detection. COMPARISON:  Previous exam(s). ACR Breast Density Category c: The breast tissue is heterogeneously dense, which may obscure small masses. FINDINGS: There are no findings suspicious for malignancy. IMPRESSION: No mammographic evidence of malignancy. A result letter of this screening mammogram will be mailed directly to the patient. RECOMMENDATION: Screening mammogram in one year. (Code:SM-B-01Y) BI-RADS CATEGORY  1: Negative. Electronically Signed   By: Sherian Rein M.D.   On: 01/26/2022 10:54    Recent Labs: Lab Results  Component Value Date   WBC 5.3 09/22/2020   HGB 15.0 09/22/2020   PLT 247 09/22/2020   NA 139 09/22/2020   K 4.9 09/22/2020   CL 103 09/22/2020   CO2 28 09/22/2020   GLUCOSE 104 (H) 09/22/2020   BUN 20 09/22/2020  CREATININE 0.79 09/22/2020   BILITOT 0.3 09/22/2020   AST 15 09/22/2020   ALT 12 09/22/2020   PROT 6.9 09/22/2020   CALCIUM 9.9 09/22/2020    Speciality Comments: No specialty comments available.  Procedures:  No procedures performed Allergies: Patient has no known allergies.   Assessment / Plan:     Visit Diagnoses: Primary osteoarthritis of both hands-she continues to have pain and stiffness in her bilateral hands.  Bilateral PIP and DIP thickening was noted.  Joint protection muscle strengthening was discussed.  Primary osteoarthritis of both knees-she has pain in her bilateral knee joints.  Lower extremity muscle strengthening exercises were discussed and emphasized.  Primary osteoarthritis of both feet -  Dr. Al Corpus, bilateral 1st MTP joint replacements May 2019.  She continues to have discomfort in her feet.  Tear of right gluteus medius tendon, subsequent encounter-she had gluteus medius and gluteus minimus rupture in the spring of last year.  She has been going to Dr. Aundria Rud.  She may need PRP injection.  She will  also get repeat MRI.  She has been going to physical therapy.  Trochanteric bursitis, right hip-she had tenderness on palpation and still have some nocturnal pain.  Stretching exercises were emphasized.  Fibromyalgia-she has generalized pain and discomfort from fibromyalgia.  Regular walking and neck stretching was emphasized.  Primary insomnia -she continues to have insomnia which has been worse since she had gluteus medius rupture.  She has been taking Ambien 2 tablets at bedtime.  I advised her to taper the dose as tolerated.  Prescription refill for Ambien was given today.  Side effects were reviewed.  Ambien 5 mg 1 to 2 tablets at bedtime as needed for insomnia.  Other fatigue-secondary to insomnia and fibromyalgia.  Other chronic pain - She is no longer taking tramadol.  Osteopenia of multiple sites - DEXA 09/28/2020 T-score: -1.4.  Use of calcium rich diet and vitamin D was discussed.  Patient will be getting labs through her PCP.  I advised her to get vitamin D level.  She will have the results forwarded to [place] once available.  Orders: No orders of the defined types were placed in this encounter.  Meds ordered this encounter  Medications   zolpidem (AMBIEN) 5 MG tablet    Sig: 1 to 2 tablets by mouth at bedtime as needed for insomnia    Dispense:  60 tablet    Refill:  2    Follow-Up Instructions: Return in about 6 months (around 08/17/2022) for Osteoarthritis.   Pollyann Savoy, MD  Note - This record has been created using Animal nutritionist.  Chart creation errors have been sought, but may not always  have been located. Such creation errors do not reflect on  the standard of medical care.

## 2022-02-14 ENCOUNTER — Ambulatory Visit (INDEPENDENT_AMBULATORY_CARE_PROVIDER_SITE_OTHER): Payer: 59 | Admitting: Rheumatology

## 2022-02-14 ENCOUNTER — Encounter: Payer: Self-pay | Admitting: Rheumatology

## 2022-02-14 VITALS — BP 161/90 | HR 76 | Ht 63.0 in | Wt 125.4 lb

## 2022-02-14 DIAGNOSIS — S76011D Strain of muscle, fascia and tendon of right hip, subsequent encounter: Secondary | ICD-10-CM

## 2022-02-14 DIAGNOSIS — G8929 Other chronic pain: Secondary | ICD-10-CM

## 2022-02-14 DIAGNOSIS — M797 Fibromyalgia: Secondary | ICD-10-CM

## 2022-02-14 DIAGNOSIS — M19042 Primary osteoarthritis, left hand: Secondary | ICD-10-CM

## 2022-02-14 DIAGNOSIS — F5101 Primary insomnia: Secondary | ICD-10-CM

## 2022-02-14 DIAGNOSIS — M170 Bilateral primary osteoarthritis of knee: Secondary | ICD-10-CM

## 2022-02-14 DIAGNOSIS — M7061 Trochanteric bursitis, right hip: Secondary | ICD-10-CM

## 2022-02-14 DIAGNOSIS — M19041 Primary osteoarthritis, right hand: Secondary | ICD-10-CM

## 2022-02-14 DIAGNOSIS — M8589 Other specified disorders of bone density and structure, multiple sites: Secondary | ICD-10-CM

## 2022-02-14 DIAGNOSIS — M19071 Primary osteoarthritis, right ankle and foot: Secondary | ICD-10-CM

## 2022-02-14 DIAGNOSIS — R5383 Other fatigue: Secondary | ICD-10-CM

## 2022-02-14 DIAGNOSIS — M19072 Primary osteoarthritis, left ankle and foot: Secondary | ICD-10-CM

## 2022-02-14 MED ORDER — ZOLPIDEM TARTRATE 5 MG PO TABS: ORAL_TABLET | ORAL | 2 refills | Status: DC

## 2022-04-12 DIAGNOSIS — Z4789 Encounter for other orthopedic aftercare: Secondary | ICD-10-CM | POA: Insufficient documentation

## 2022-04-27 ENCOUNTER — Other Ambulatory Visit: Payer: Self-pay | Admitting: Physician Assistant

## 2022-04-27 NOTE — Telephone Encounter (Signed)
Next Visit: 08/17/2022  Last Visit: 02/14/2022  Last Fill:01/29/2022  Dx: : Primary osteoarthritis of both hands  Current Dose per office note on 02/14/2022: not mentioned  Okay to refill Cymbalta?

## 2022-05-15 ENCOUNTER — Other Ambulatory Visit: Payer: Self-pay | Admitting: Rheumatology

## 2022-05-25 NOTE — Progress Notes (Addendum)
COVID Vaccine Completed:  Yes  Date of COVID positive in last 90 days:  No  PCP - Sela Hilding, MD Cardiologist - N/A  Chest x-ray - N/A EKG - 06-05-22 Epic Stress Test - N/A ECHO - N/A Cardiac Cath - N/A Pacemaker/ICD device last checked: Spinal Cord Stimulator:N/A  Bowel Prep - N/A  Sleep Study - N/A CPAP -   Fasting Blood Sugar - N/A Checks Blood Sugar _____ times a day  Blood Thinner Instructions:N/A Aspirin Instructions: Last Dose:  Activity level:   Can go up a flight of stairs and perform activities of daily living without stopping and without symptoms of chest pain or shortness of breath.  Anesthesia review: N/A  Patient denies shortness of breath, fever, cough and chest pain at PAT appointment  Patient verbalized understanding of instructions that were given to them at the PAT appointment. Patient was also instructed that they will need to review over the PAT instructions again at home before surgery.

## 2022-05-25 NOTE — Patient Instructions (Addendum)
DUE TO COVID-19 ONLY TWO VISITORS  (aged 61 and older)  IS ALLOWED TO COME WITH YOU AND STAY IN THE WAITING ROOM ONLY DURING PRE OP AND PROCEDURE.   **NO VISITORS ARE ALLOWED IN THE SHORT STAY AREA OR RECOVERY ROOM!!**   You are not required to quarantine Hand Hygiene often Do NOT share personal items Notify your provider if you are in close contact with someone who has COVID or you develop fever 100.4 or greater, new onset of sneezing, cough, sore throat, shortness of breath or body aches.        Your procedure is scheduled on:  06-08-22   Report to [facility] Main Entrance    Report to admitting at 10:15 AM   Call this number if you have problems the morning of surgery [phone]   Do not eat food :After Midnight the night before surgery   After Midnight you may have the following liquids until 9:30 AM/ DAY OF SURGERY  Clear Liquid Diet Water Black Coffee (sugar ok, NO MILK/CREAM OR CREAMERS)  Tea (sugar ok, NO MILK/CREAM OR CREAMERS) regular and decaf                             Plain Jell-O (NO RED)                                           Fruit ices (not with fruit pulp, NO RED)                                     Popsicles (NO RED)                                                                  Juice: apple, WHITE grape, WHITE cranberry Sports drinks like Gatorade (NO RED) Clear broth(vegetable,chicken,beef)                   The day of surgery:  Drink ONE (1) Pre-Surgery Clear Ensure at 9:30 AM the morning of surgery. Drink in one sitting. Do not sip.  This drink was given to you during your hospital  pre-op appointment visit. Nothing else to drink after completing the Pre-Surgery Clear Ensure           If you have questions, please contact your surgeon's office.   FOLLOW  ANY ADDITIONAL PRE OP INSTRUCTIONS YOU RECEIVED FROM YOUR SURGEON'S OFFICE!!!     Oral Hygiene is also important to reduce your risk of infection.                                     Remember - BRUSH YOUR TEETH THE MORNING OF SURGERY WITH YOUR REGULAR TOOTHPASTE   Do NOT smoke after Midnight   Take these medicines the morning of surgery with A SIP OF WATER: Duloxetine, Amlodipine   DO NOT [place]. PHARMACY WILL DISPENSE MEDICATIONS LISTED ON YOUR MEDICATION LIST  TO YOU DURING YOUR ADMISSION [place]!                              You may not have any metal on your body including hair pins, jewelry, and body piercing             Do not wear make-up, lotions, powders, perfumes or deodorant  Do not wear nail polish including gel and S&S, artificial/acrylic nails, or any other type of covering on natural nails including finger and toenails. If you have artificial nails, gel coating, etc. that needs to be removed by a nail salon please have this removed prior to surgery or surgery may need to be canceled/ delayed if the surgeon/ anesthesia feels like they are unable to be safely monitored.   Do not shave  48 hours prior to surgery.    Contacts, dentures or bridgework may not be worn into surgery.  Do not bring valuables to the hospital. [place].   Patients discharged on the day of surgery will not be allowed to drive home.  Someone NEEDS to stay with you for the first 24 hours after anesthesia.  Please read over the following fact sheets you were given: IF YOU HAVE QUESTIONS ABOUT YOUR PRE-OP INSTRUCTIONS PLEASE CALL [place] - Preparing for Surgery Before surgery, you can play an important role.  Because skin is not sterile, your skin needs to be as free of germs as possible.  You can reduce the number of germs on your skin by washing with CHG (chlorahexidine gluconate) soap before surgery.  CHG is an antiseptic cleaner which kills germs and bonds with the skin to continue killing germs even after washing. Please DO NOT use if you have an allergy to CHG or  antibacterial soaps.  If your skin becomes reddened/irritated stop using the CHG and inform your nurse when you arrive at Short Stay. Do not shave (including legs and underarms) for at least 48 hours prior to the first CHG shower.  You may shave your face/neck.  Please follow these instructions carefully:  1.  Shower with CHG Soap the night before surgery and the  morning of surgery.  2.  If you choose to wash your hair, wash your hair first as usual with your normal  shampoo.  3.  After you shampoo, rinse your hair and body thoroughly to remove the shampoo.                             4.  Use CHG as you would any other liquid soap.  You can apply chg directly to the skin and wash.  Gently with a scrungie or clean washcloth.  5.  Apply the CHG Soap to your body ONLY FROM THE NECK DOWN.   Do   not use on face/ open                           Wound or open sores. Avoid contact with eyes, ears mouth and   genitals (private parts).                       Wash face,  Genitals (private parts) with your normal soap.             6.  Wash thoroughly, paying special attention to the area where your    surgery  will be performed.  7.  Thoroughly rinse your body with warm water from the neck down.  8.  DO NOT shower/wash with your normal soap after using and rinsing off the CHG Soap.                9.  Pat yourself dry with a clean towel.            10.  Wear clean pajamas.            11.  Place clean sheets on your bed the night of your first shower and do not  sleep with pets. Day of Surgery : Do not apply any lotions/deodorants the morning of surgery.  Please wear clean clothes to the hospital/surgery center.  FAILURE TO FOLLOW THESE INSTRUCTIONS MAY RESULT IN THE CANCELLATION OF YOUR SURGERY  PATIENT SIGNATURE_________________________________  NURSE SIGNATURE__________________________________  ________________________________________________________________________     Desiree Randall  An  incentive spirometer is a tool that can help keep your lungs clear and active. This tool measures how well you are filling your lungs with each breath. Taking long deep breaths may help reverse or decrease the chance of developing breathing (pulmonary) problems (especially infection) following: A long period of time when you are unable to move or be active. BEFORE THE PROCEDURE  If the spirometer includes an indicator to show your best effort, your nurse or respiratory therapist will set it to a desired goal. If possible, sit up straight or lean slightly forward. Try not to slouch. Hold the incentive spirometer in an upright position. INSTRUCTIONS FOR USE  Sit on the edge of your bed if possible, or sit up as far as you can in bed or on a chair. Hold the incentive spirometer in an upright position. Breathe out normally. Place the mouthpiece in your mouth and seal your lips tightly around it. Breathe in slowly and as deeply as possible, raising the piston or the ball toward the top of the column. Hold your breath for 3-5 seconds or for as long as possible. Allow the piston or ball to fall to the bottom of the column. Remove the mouthpiece from your mouth and breathe out normally. Rest for a few seconds and repeat Steps 1 through 7 at least 10 times every 1-2 hours when you are awake. Take your time and take a few normal breaths between deep breaths. The spirometer may include an indicator to show your best effort. Use the indicator as a goal to work toward during each repetition. After each set of 10 deep breaths, practice coughing to be sure your lungs are clear. If you have an incision (the cut made at the time of surgery), support your incision when coughing by placing a pillow or rolled up towels firmly against it. Once you are able to get out of bed, walk around indoors and cough well. You may stop using the incentive spirometer when instructed by your caregiver.  RISKS AND COMPLICATIONS Take  your time so you do not get dizzy or light-headed. If you are in pain, you may need to take or ask for pain medication before doing incentive spirometry. It is harder to take a deep breath if you are having pain. AFTER USE Rest and breathe slowly and easily. It can be helpful to keep track of a log of your progress. Your caregiver can provide you with a simple table to help with this.  If you are using the spirometer at home, follow these instructions: [place] IF:  You are having difficultly using the spirometer. You have trouble using the spirometer as often as instructed. Your pain medication is not giving enough relief while using the spirometer. You develop fever of 100.5 F (38.1 C) or higher. SEEK IMMEDIATE MEDICAL CARE IF:  You cough up bloody sputum that had not been present before. You develop fever of 102 F (38.9 C) or greater. You develop worsening pain at or near the incision site. MAKE SURE YOU:  Understand these instructions. Will watch your condition. Will get help right away if you are not doing well or get worse. Document Released: 03/18/2007 Document Revised: 01/28/2012 Document Reviewed: 05/19/2007 [facility] Patient Information 2014 [place], [place].   ________________________________________________________________________

## 2022-06-05 ENCOUNTER — Encounter (HOSPITAL_COMMUNITY): Payer: Self-pay

## 2022-06-05 ENCOUNTER — Encounter (HOSPITAL_COMMUNITY)
Admission: RE | Admit: 2022-06-05 | Discharge: 2022-06-05 | Disposition: A | Discharge status: Home / Self Care | Payer: 59 | Source: Ambulatory Visit | Attending: Orthopedic Surgery | Admitting: Orthopedic Surgery

## 2022-06-05 ENCOUNTER — Other Ambulatory Visit: Payer: Self-pay

## 2022-06-05 VITALS — BP 155/85 | HR 87 | Temp 98.7°F | Resp 14 | Ht 63.0 in | Wt 123.4 lb

## 2022-06-05 DIAGNOSIS — Z01818 Encounter for other preprocedural examination: Secondary | ICD-10-CM | POA: Insufficient documentation

## 2022-06-05 DIAGNOSIS — I2510 Atherosclerotic heart disease of native coronary artery without angina pectoris: Secondary | ICD-10-CM | POA: Insufficient documentation

## 2022-06-05 HISTORY — DX: Pneumonia, unspecified organism: J18.9

## 2022-06-05 HISTORY — DX: Essential (primary) hypertension: I10

## 2022-06-05 HISTORY — DX: Basal cell carcinoma of skin of other part of trunk: C44.519

## 2022-06-05 HISTORY — DX: Gastro-esophageal reflux disease without esophagitis: K21.9

## 2022-06-05 LAB — BASIC METABOLIC PANEL
Anion gap: 7 (ref 5–15)
BUN: 22 mg/dL (ref 8–23)
CO2: 25 mmol/L (ref 22–32)
Calcium: 9.3 mg/dL (ref 8.9–10.3)
Chloride: 108 mmol/L (ref 98–111)
Creatinine, Ser: 0.58 mg/dL (ref 0.44–1.00)
GFR, Estimated: >60 mL/min (ref >60)
Glucose, Bld: 103 mg/dL — ABNORMAL HIGH (ref 70–99)
Potassium: 4.6 mmol/L (ref 3.5–5.1)
Sodium: 140 mmol/L (ref 135–145)

## 2022-06-08 ENCOUNTER — Ambulatory Visit (HOSPITAL_COMMUNITY): Payer: 59 | Admitting: Certified Registered Nurse Anesthetist

## 2022-06-08 ENCOUNTER — Ambulatory Visit (HOSPITAL_BASED_OUTPATIENT_CLINIC_OR_DEPARTMENT_OTHER): Payer: 59 | Admitting: Certified Registered Nurse Anesthetist

## 2022-06-08 ENCOUNTER — Encounter (HOSPITAL_COMMUNITY): Payer: Self-pay | Admitting: Orthopedic Surgery

## 2022-06-08 ENCOUNTER — Encounter (HOSPITAL_COMMUNITY)
Admission: RE | Disposition: A | Discharge status: Home / Self Care | Payer: Self-pay | Source: Ambulatory Visit | Attending: Orthopedic Surgery

## 2022-06-08 ENCOUNTER — Other Ambulatory Visit: Payer: Self-pay

## 2022-06-08 ENCOUNTER — Ambulatory Visit (HOSPITAL_COMMUNITY)
Admission: RE | Admit: 2022-06-08 | Discharge: 2022-06-08 | Disposition: A | Discharge status: Home / Self Care | Payer: 59 | Source: Ambulatory Visit | Attending: Orthopedic Surgery | Admitting: Orthopedic Surgery

## 2022-06-08 DIAGNOSIS — I10 Essential (primary) hypertension: Secondary | ICD-10-CM | POA: Insufficient documentation

## 2022-06-08 DIAGNOSIS — S76311A Strain of muscle, fascia and tendon of the posterior muscle group at thigh level, right thigh, initial encounter: Secondary | ICD-10-CM

## 2022-06-08 DIAGNOSIS — K219 Gastro-esophageal reflux disease without esophagitis: Secondary | ICD-10-CM | POA: Insufficient documentation

## 2022-06-08 DIAGNOSIS — S76011A Strain of muscle, fascia and tendon of right hip, initial encounter: Secondary | ICD-10-CM | POA: Insufficient documentation

## 2022-06-08 DIAGNOSIS — X58XXXA Exposure to other specified factors, initial encounter: Secondary | ICD-10-CM | POA: Diagnosis not present

## 2022-06-08 HISTORY — PX: OPEN SURGICAL REPAIR OF GLUTEAL TENDON: SHX5995

## 2022-06-08 SURGERY — REPAIR, TENDON, GLUTEUS MEDIUS, OPEN
Anesthesia: General | Laterality: Right

## 2022-06-08 MED ORDER — LIDOCAINE HCL (PF) 2 % IJ SOLN
INTRAMUSCULAR | Status: AC
  Filled 2022-06-08: qty 5

## 2022-06-08 MED ORDER — PHENYLEPHRINE HCL (PRESSORS) 10 MG/ML IV SOLN
INTRAVENOUS | Status: AC
  Filled 2022-06-08: qty 1

## 2022-06-08 MED ORDER — PROPOFOL 10 MG/ML IV BOLUS
INTRAVENOUS | Status: DC | PRN
  Administered 2022-06-08: 150 mg via INTRAVENOUS
  Administered 2022-06-08: 20 mg via INTRAVENOUS

## 2022-06-08 MED ORDER — EPHEDRINE 5 MG/ML INJ
INTRAVENOUS | Status: AC
  Filled 2022-06-08: qty 5

## 2022-06-08 MED ORDER — FENTANYL CITRATE (PF) 100 MCG/2ML IJ SOLN
INTRAMUSCULAR | Status: AC
  Filled 2022-06-08: qty 2

## 2022-06-08 MED ORDER — ORAL CARE MOUTH RINSE: 15.0000 mL | Freq: Once | OROMUCOSAL | Status: AC

## 2022-06-08 MED ORDER — METHOCARBAMOL 750 MG PO TABS: 750.0000 mg | ORAL_TABLET | Freq: Four times a day (QID) | ORAL | 0 refills | Status: DC | PRN

## 2022-06-08 MED ORDER — KETOROLAC TROMETHAMINE 30 MG/ML IJ SOLN
30.0000 mg | Freq: Once | INTRAMUSCULAR | Status: AC
  Administered 2022-06-08: 30 mg via INTRAVENOUS

## 2022-06-08 MED ORDER — ONDANSETRON HCL 4 MG/2ML IJ SOLN
INTRAMUSCULAR | Status: AC
  Filled 2022-06-08: qty 2

## 2022-06-08 MED ORDER — HYDROMORPHONE HCL 1 MG/ML IJ SOLN
INTRAMUSCULAR | Status: AC
  Filled 2022-06-08: qty 2

## 2022-06-08 MED ORDER — PHENYLEPHRINE HCL-NACL 20-0.9 MG/250ML-% IV SOLN
INTRAVENOUS | Status: DC | PRN
  Administered 2022-06-08: 35 ug/min via INTRAVENOUS

## 2022-06-08 MED ORDER — CEFAZOLIN SODIUM-DEXTROSE 2-4 GM/100ML-% IV SOLN
2.0000 g | INTRAVENOUS | Status: AC
  Administered 2022-06-08: 2 g via INTRAVENOUS
  Filled 2022-06-08: qty 100

## 2022-06-08 MED ORDER — MIDAZOLAM HCL 5 MG/5ML IJ SOLN
INTRAMUSCULAR | Status: DC | PRN
  Administered 2022-06-08: 2 mg via INTRAVENOUS

## 2022-06-08 MED ORDER — BUPIVACAINE-EPINEPHRINE (PF) 0.25% -1:200000 IJ SOLN
INTRAMUSCULAR | Status: AC
  Filled 2022-06-08: qty 30

## 2022-06-08 MED ORDER — ONDANSETRON HCL 4 MG/2ML IJ SOLN
INTRAMUSCULAR | Status: DC | PRN
  Administered 2022-06-08: 4 mg via INTRAVENOUS

## 2022-06-08 MED ORDER — FENTANYL CITRATE (PF) 100 MCG/2ML IJ SOLN
INTRAMUSCULAR | Status: DC | PRN
  Administered 2022-06-08: 100 ug via INTRAVENOUS
  Administered 2022-06-08 (×2): 50 ug via INTRAVENOUS

## 2022-06-08 MED ORDER — ONDANSETRON HCL 4 MG PO TABS: 4.0000 mg | ORAL_TABLET | Freq: Three times a day (TID) | ORAL | 1 refills | Status: DC | PRN

## 2022-06-08 MED ORDER — LACTATED RINGERS IV SOLN: INTRAVENOUS | Status: DC

## 2022-06-08 MED ORDER — PHENYLEPHRINE 80 MCG/ML (10ML) SYRINGE FOR IV PUSH (FOR BLOOD PRESSURE SUPPORT)
PREFILLED_SYRINGE | INTRAVENOUS | Status: DC | PRN
  Administered 2022-06-08 (×4): 80 ug via INTRAVENOUS

## 2022-06-08 MED ORDER — EPHEDRINE SULFATE-NACL 50-0.9 MG/10ML-% IV SOSY
PREFILLED_SYRINGE | INTRAVENOUS | Status: DC | PRN
  Administered 2022-06-08: 5 mg via INTRAVENOUS

## 2022-06-08 MED ORDER — KETOROLAC TROMETHAMINE 30 MG/ML IJ SOLN
INTRAMUSCULAR | Status: AC
  Filled 2022-06-08: qty 1

## 2022-06-08 MED ORDER — BUPIVACAINE-EPINEPHRINE (PF) 0.25% -1:200000 IJ SOLN
INTRAMUSCULAR | Status: DC | PRN
  Administered 2022-06-08: 30 mL

## 2022-06-08 MED ORDER — OXYCODONE-ACETAMINOPHEN 7.5-325 MG PO TABS: 1.0000 | ORAL_TABLET | ORAL | 0 refills | Status: DC | PRN

## 2022-06-08 MED ORDER — ROCURONIUM BROMIDE 10 MG/ML (PF) SYRINGE
PREFILLED_SYRINGE | INTRAVENOUS | Status: DC | PRN
  Administered 2022-06-08: 50 mg via INTRAVENOUS

## 2022-06-08 MED ORDER — MIDAZOLAM HCL 2 MG/2ML IJ SOLN
INTRAMUSCULAR | Status: AC
  Filled 2022-06-08: qty 2

## 2022-06-08 MED ORDER — OXYCODONE HCL 5 MG PO TABS
ORAL_TABLET | ORAL | Status: AC
  Filled 2022-06-08: qty 1

## 2022-06-08 MED ORDER — ACETAMINOPHEN 500 MG PO TABS
1000.0000 mg | ORAL_TABLET | Freq: Once | ORAL | Status: AC
  Administered 2022-06-08: 1000 mg via ORAL
  Filled 2022-06-08: qty 2

## 2022-06-08 MED ORDER — MEPERIDINE HCL 50 MG/ML IJ SOLN
INTRAMUSCULAR | Status: AC
  Filled 2022-06-08: qty 1

## 2022-06-08 MED ORDER — PROPOFOL 10 MG/ML IV BOLUS
INTRAVENOUS | Status: AC
  Filled 2022-06-08: qty 20

## 2022-06-08 MED ORDER — SUGAMMADEX SODIUM 200 MG/2ML IV SOLN
INTRAVENOUS | Status: DC | PRN
  Administered 2022-06-08: 200 mg via INTRAVENOUS

## 2022-06-08 MED ORDER — LIDOCAINE 2% (20 MG/ML) 5 ML SYRINGE
INTRAMUSCULAR | Status: DC | PRN
  Administered 2022-06-08: 60 mg via INTRAVENOUS

## 2022-06-08 MED ORDER — OXYCODONE HCL 5 MG PO TABS
5.0000 mg | ORAL_TABLET | Freq: Once | ORAL | Status: AC
  Administered 2022-06-08: 5 mg via ORAL

## 2022-06-08 MED ORDER — DEXAMETHASONE SODIUM PHOSPHATE 10 MG/ML IJ SOLN
INTRAMUSCULAR | Status: AC
  Filled 2022-06-08: qty 1

## 2022-06-08 MED ORDER — HYDROMORPHONE HCL 1 MG/ML IJ SOLN
0.2500 mg | INTRAMUSCULAR | Status: DC | PRN
  Administered 2022-06-08 (×2): 0.5 mg via INTRAVENOUS

## 2022-06-08 MED ORDER — ROCURONIUM BROMIDE 10 MG/ML (PF) SYRINGE
PREFILLED_SYRINGE | INTRAVENOUS | Status: AC
  Filled 2022-06-08: qty 10

## 2022-06-08 MED ORDER — MEPERIDINE HCL 50 MG/ML IJ SOLN
6.2500 mg | INTRAMUSCULAR | Status: DC | PRN
  Administered 2022-06-08: 6.25 mg via INTRAVENOUS

## 2022-06-08 MED ORDER — DEXAMETHASONE SODIUM PHOSPHATE 10 MG/ML IJ SOLN
INTRAMUSCULAR | Status: DC | PRN
  Administered 2022-06-08: 10 mg via INTRAVENOUS

## 2022-06-08 MED ORDER — 0.9 % SODIUM CHLORIDE (POUR BTL) OPTIME
TOPICAL | Status: DC | PRN
  Administered 2022-06-08: 1000 mL

## 2022-06-08 MED ORDER — CHLORHEXIDINE GLUCONATE 0.12 % MT SOLN
15.0000 mL | Freq: Once | OROMUCOSAL | Status: AC
  Administered 2022-06-08: 15 mL via OROMUCOSAL

## 2022-06-08 SURGICAL SUPPLY — 61 items
ANCH SUT 2.6 FBRSTK 1.7 (Anchor) ×1 IMPLANT
ANCH SUT 2.6 FBRTK 1.7 KNTLS (Anchor) ×1 IMPLANT
ANCH SUT SWLK 19.1X4.75 (Anchor) ×1 IMPLANT
ANCHOR SUT BIO SW 4.75X19.1 (Anchor) ×1 IMPLANT
ANCHOR SUT FBRTK 2.6X1.7X2 (Anchor) ×1 IMPLANT
BAG COUNTER SPONGE SURGICOUNT (BAG) ×1 IMPLANT
BAG SPEC THK2 15X12 ZIP CLS (MISCELLANEOUS) ×1
BAG SPNG CNTER NS LX DISP (BAG) ×1
BAG ZIPLOCK 12X15 (MISCELLANEOUS) ×2 IMPLANT
Bio tenodesis disp kit ×1 IMPLANT
CLSR STERI-STRIP ANTIMIC 1/2X4 (GAUZE/BANDAGES/DRESSINGS) ×1 IMPLANT
COVER SURGICAL LIGHT HANDLE (MISCELLANEOUS) ×2 IMPLANT
DRAPE HIP W/POCKET STRL (MISCELLANEOUS) ×1 IMPLANT
DRAPE ORTHO SPLIT 77X108 STRL (DRAPES) ×2
DRAPE SURG 17X11 SM STRL (DRAPES) ×2 IMPLANT
DRAPE SURG ORHT 6 SPLT 77X108 (DRAPES) ×1 IMPLANT
DRAPE U-SHAPE 47X51 STRL (DRAPES) ×2 IMPLANT
DRESSING MEPILEX FLEX 4X4 (GAUZE/BANDAGES/DRESSINGS) ×1 IMPLANT
DRSG AQUACEL AG ADV 3.5X10 (GAUZE/BANDAGES/DRESSINGS) ×1 IMPLANT
DRSG EMULSION OIL 3X16 NADH (GAUZE/BANDAGES/DRESSINGS) ×2 IMPLANT
DRSG MEPILEX BORDER 4X8 (GAUZE/BANDAGES/DRESSINGS) ×2 IMPLANT
DRSG MEPILEX FLEX 4X4 (GAUZE/BANDAGES/DRESSINGS) ×2
DRSG PAD ABDOMINAL 8X10 ST (GAUZE/BANDAGES/DRESSINGS) ×2 IMPLANT
DURAPREP 26ML APPLICATOR (WOUND CARE) ×2 IMPLANT
ELECT REM PT RETURN 15FT ADLT (MISCELLANEOUS) ×2 IMPLANT
GAUZE SPONGE 4X4 12PLY STRL (GAUZE/BANDAGES/DRESSINGS) ×2 IMPLANT
GLOVE BIOGEL M 7.0 STRL (GLOVE) ×2 IMPLANT
GLOVE BIOGEL PI IND STRL 7.5 (GLOVE) ×1 IMPLANT
GLOVE BIOGEL PI IND STRL 8.5 (GLOVE) ×1 IMPLANT
GLOVE BIOGEL PI INDICATOR 7.5 (GLOVE) ×1
GLOVE BIOGEL PI INDICATOR 8.5 (GLOVE) ×1
GLOVE ECLIPSE 8.0 STRL XLNG CF (GLOVE) ×1 IMPLANT
GLOVE SURG ORTHO 8.0 STRL STRW (GLOVE) ×2 IMPLANT
GOWN STRL REUS W/ TWL XL LVL3 (GOWN DISPOSABLE) ×2 IMPLANT
GOWN STRL REUS W/TWL XL LVL3 (GOWN DISPOSABLE) ×4
IMPL FIBERTAK KNTLS 2.6 (Anchor) IMPLANT
IMPLANT FIBERTAK KNTLS 2.6 (Anchor) ×2 IMPLANT
KIT BASIN OR (CUSTOM PROCEDURE TRAY) ×2 IMPLANT
KIT BIO-TENODESIS 3X8 DISP (MISCELLANEOUS) ×2
KIT INSRT BABSR STRL DISP BTN (MISCELLANEOUS) IMPLANT
KIT TURNOVER KIT A (KITS) ×1 IMPLANT
MANIFOLD NEPTUNE II (INSTRUMENTS) ×2 IMPLANT
NEEDLE HYPO 22GX1.5 SAFETY (NEEDLE) ×1 IMPLANT
NS IRRIG 1000ML POUR BTL (IV SOLUTION) ×2 IMPLANT
PACK TOTAL JOINT (CUSTOM PROCEDURE TRAY) ×2 IMPLANT
PROTECTOR NERVE ULNAR (MISCELLANEOUS) ×2 IMPLANT
STAPLER VISISTAT (STAPLE) IMPLANT
SUT ETHIBOND NAB CT1 #1 30IN (SUTURE) IMPLANT
SUT FIBERWIRE #2 38 T-5 BLUE (SUTURE)
SUT MNCRL AB 3-0 PS2 18 (SUTURE) ×1 IMPLANT
SUT MNCRL AB 4-0 PS2 18 (SUTURE) ×2 IMPLANT
SUT STRATAFIX 1PDS 45CM VIOLET (SUTURE) ×1 IMPLANT
SUT VIC AB 1 CT1 27 (SUTURE) ×6
SUT VIC AB 1 CT1 27XBRD ANTBC (SUTURE) ×3 IMPLANT
SUT VIC AB 1 CT1 36 (SUTURE) ×1 IMPLANT
SUT VIC AB 2-0 CT1 27 (SUTURE) ×6
SUT VIC AB 2-0 CT1 TAPERPNT 27 (SUTURE) ×1 IMPLANT
SUTURE FIBERWR #2 38 T-5 BLUE (SUTURE) IMPLANT
SYR 30ML LL (SYRINGE) ×1 IMPLANT
TOWEL OR 17X26 10 PK STRL BLUE (TOWEL DISPOSABLE) ×4 IMPLANT
WATER STERILE IRR 1000ML POUR (IV SOLUTION) ×2 IMPLANT

## 2022-06-08 NOTE — Anesthesia Procedure Notes (Signed)
Procedure Name: Intubation Date/Time: 06/08/2022 12:28 PM  Performed by: Maxwell Caul, CRNAPre-anesthesia Checklist: Patient identified, Emergency Drugs available, Suction available and Patient being monitored Patient Re-evaluated:Patient Re-evaluated prior to induction Oxygen Delivery Method: Circle system utilized Preoxygenation: Pre-oxygenation with 100% oxygen Induction Type: IV induction Ventilation: Mask ventilation without difficulty Laryngoscope Size: Mac and 4 Grade View: Grade II Tube type: Oral Tube size: 7.0 mm Number of attempts: 1 Airway Equipment and Method: Stylet Placement Confirmation: ETT inserted through vocal cords under direct vision, positive ETCO2 and breath sounds checked- equal and bilateral Secured at: 21 cm Tube secured with: Tape Dental Injury: Teeth and Oropharynx as per pre-operative assessment

## 2022-06-08 NOTE — H&P (Signed)
ORTHOPAEDIC H&P  REQUESTING PHYSICIAN: Nicholes Stairs, MD  PCP:  Glenis Smoker, MD  Chief Complaint: Right hip gluteus tendon tear  HPI: Desiree Randall is a 61 y.o. female who complains of recalcitrant right hip pain.  She had conservative care now for over a year.  Continues with weakness and pain.  Here today for open gluteus tendon repair.  No new complaints at this time.  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   TUBAL LIGATION  2003   LEFT FALLOPIAN TUBE REMOVED   Social History   Socioeconomic History   Marital status: Married    Spouse name: Not on file   Number of children: Not on file   Years of education: Not on file   Highest education level: Not on file  Occupational History   Not on file  Tobacco Use   Smoking status: Never    Passive exposure: Never   Smokeless tobacco: Never  Vaping Use   Vaping Use: Never used  Substance and Sexual Activity   Alcohol use: Yes    Alcohol/week: 0.0 standard drinks of alcohol    Comment: rarely   Drug use: No   Sexual activity: Yes    Birth control/protection: Surgical  Other Topics Concern   Not on file  Social History Narrative   Not on file   Social Determinants of Health   Financial Resource Strain: Not on file  Food Insecurity: Not on file  Transportation Needs: Not on file  Physical Activity: Not on file  Stress: Not on file  Social Connections: Not on file   Family History  Problem Relation Age of Onset   Melanoma Mother    Heart disease Mother    Hypertension Mother    Heart attack Mother    Heart  disease Father        TRIPLE BYPASS   Hypercholesterolemia Father    Heart failure Maternal Grandmother    Heart attack Maternal Grandfather    Stroke Maternal Grandfather    Aneurysm Maternal Grandfather    Heart attack Paternal Grandmother    Heart disease Paternal Grandfather    Kidney Stones Brother    Breast cancer Neg Hx    No Known Allergies Prior to Admission medications   Medication Sig Start Date End Date Taking? Authorizing Provider  amLODipine (NORVASC) 5 MG tablet Take 5 mg by mouth daily.   Yes [provider]  Azelastine HCl (ASTEPRO) 0.15 % SOLN Place 1 spray into the nose daily as needed (allergies).   Yes [provider]  Biotin w/ Vitamins C & E (HAIR/SKIN/NAILS PO) Take 1 tablet by mouth daily.   Yes [provider]  Calcium Carb-Cholecalciferol (CALCIUM 600/VITAMIN D3 PO) Take 1 tablet by mouth daily.   Yes [provider]  diclofenac sodium (VOLTAREN) 1 % GEL Apply 3 g to 3 large joints up to 3 times daily Patient taking differently: Apply 3 g topically 3 (three) times daily as needed (pain). 06/17/18  Yes Ofilia Neas,  PA-C  DULoxetine (CYMBALTA) 60 MG capsule TAKE ONE CAPSULE BY MOUTH DAILY 04/27/22  Yes Deveshwar, Abel Presto, MD  Multiple Vitamins-Minerals (MULTIVITAMIN WITH MINERALS) tablet Take 1 tablet by mouth daily.   Yes [provider]  olopatadine (PATADAY) 0.1 % ophthalmic solution Place 1 drop into both eyes 2 (two) times daily as needed for allergies.   Yes [provider]  Red Yeast Rice 600 MG CAPS Take 1,200 mg by mouth daily.   Yes [provider]  zolpidem (AMBIEN) 5 MG tablet TAKE ONE TO TWO TABLETS BY MOUTH EVERY NIGHT AT BEDTIME AS NEEDED FOR INSOMNIA Patient taking differently: Take 5-10 mg by mouth at bedtime. 05/15/22  Yes Ofilia Neas, PA-C   No results found.  Positive ROS: All other systems have been reviewed and were otherwise negative with the exception of those mentioned in  the HPI and as above.  Physical Exam: General: Alert, no acute distress Cardiovascular: No pedal edema Respiratory: No cyanosis, no use of accessory musculature GI: No organomegaly, abdomen is soft and non-tender Skin: No lesions in the area of chief complaint Neurologic: Sensation intact distally Psychiatric: Patient is competent for consent with normal mood and affect Lymphatic: No axillary or cervical lymphadenopathy  MUSCULOSKELETAL: Right hip is warm and well-perfused.  No open wounds or lesions.  Neurovascular intact.  Assessment: 1.  Tear of right gluteus medius tendon  Plan: -Plan to proceed today with open gluteus medius tendon repair.  We discussed again the risk of bleeding, infection, damage to surrounding nerves and vessels, stiffness, failure repairs, persistent pain, need for revision surgery as well as the risk of DVT and the risk of anesthesia.  She has provided informed consent.  -Plan for discharge home postoperatively from PACU.    Nicholes Stairs, MD Cell [phone]    06/08/2022 12:17 PM

## 2022-06-08 NOTE — Op Note (Signed)
INDICATIONS: Desiree Randall is a 61 y.o.-year-old female with a right sided high-grade gluteus medius tendon tear.  She has had exhaustive conservative treatment with therapies and injections and oral medications.  She is here today for open repair of the partial gluteus medius tendon tear.  After discussion of the risk of bleeding, infection, damage to surrounding nerves and vessels, stiffness, failure of repair, need for revision surgery, development of DVT as well as the risk of anesthesia she has provided informed consent.;  The patient did consent to the procedure after discussion of the risks and benefits.   PREOPERATIVE DIAGNOSIS: Right hip gluteus medius tear   POSTOPERATIVE DIAGNOSIS: Same.   PROCEDURE: Open repair of gluteus medius tendon tear right hip   SURGEON: Geralynn Rile, M.D.   ASSIST: Jonelle Sidle, PA-C.  Assistant attestation:  PA Thereasa Solo was present for the entire procedure.   ANESTHESIA:  general, local with 1/4% Marcaine with epinephrine   IV FLUIDS AND URINE: See anesthesia.   ESTIMATED BLOOD LOSS: 25 mL.   IMPLANTS: Arthrex 2.6 mm knotless fiber tack suture x2 Arthrex 4.75 mm swivel lock anchor x1   DRAINS: None   COMPLICATIONS: None.   DESCRIPTION OF PROCEDURE: The patient was brought to the operating room and placed supine on the operating table.  The patient had been signed prior to the procedure and this was documented. The patient had the anesthesia placed by the anesthesiologist.  A time-out was performed to confirm that this was the correct patient, site, side and location. The patient did receive antibiotics prior to the incision and was re-dosed during the procedure as needed at indicated intervals.  Patient was then placed in the left lateral decubitus position with an axillary roll and a beanbag.  The patient had the operative extremity prepped and draped in the standard surgical fashion.      We began the procedure by performing a direct lateral  approach to the proximal femur at the greater trochanter.  Dissection was carried down through skin and subcutaneous fat to the layer of the iliotibial band.  The iliotibial band was then split in line with its fibers sharply.  We then bluntly divided the gluteus maximus just a bit posteriorly to have better ability to retract.  We then placed deep retractors and identified the greater trochanter.  We did not see any obvious bare areas on the trochanter which would be consistent with full-thickness tear.  She did have a rent in the gluteus medius tendon right at the bony attachment.  This was also boggy when palpated.  Of note, there was not just mild bursitis noted.  We then planned for our trans tendon repair and a double row technique.  We utilized a microfracture awl to biologically prepare through the tendon and the greater trochanteric bone.  We made 3 passes with this all.  We then placed 2 trans tendon anchors with a 2.6 mm knotless fiber tack anchors.  We placed these in a position that they were proximal to the rent in the tendon such that we could achieve compression along that compromised area.  We then utilized the knotless mechanism of the anterior and posterior 2.6 mm fiber tack anchors to achieve a double pulley technique with #5 sutures coming from each anchor respectively.  We then passed these through the loop suture and compressed the tendon at the level of the suture anchors.  Next, we loaded the 4 tails of the fiber tape sutures into a 4.75 mm swivel  lock anchor.  We selected a position distal on the trochanter but anterior to the vastus lateralis.  We placed a awl punch to prepare the canal.  We then delivered the anchor through that and had excellent footprint compression of the tendon at that level.  We then cut the free ends of the sutures.  This completed our repair.   Next we copiously irrigated the wound with normal saline.  We then repaired the iliotibial band with #1 strata fix in  running locking fashion.  We then irrigated once again.  We then administered local anesthetic in the field with quarter percent Marcaine with epinephrine.  We then closed the fat layer with #1 Vicryl.  Skin was closed with 2-0 Vicryl for the dermal layer and running subcuticular 3-0 Monocryl for the skin with Steri-Strips and standard sterile bandage.   Patient tolerated the procedure well with no noted complications.  All counts were correct x2.  She was transported to PACU in stable condition.    POSTOPERATIVE PLAN:  Patient will be weightbearing as tolerated to the right lower extremity but will use crutches for the first 3 weeks.  She will avoid active abduction and full weightbearing on the operative extremity.  She will take 81 mg aspirin once per day for 6 weeks for DVT prophylaxis.  I will see her back in the office in 2 weeks.  She will discharge home today from PACU.

## 2022-06-08 NOTE — Anesthesia Preprocedure Evaluation (Addendum)
Anesthesia Evaluation  Patient identified by MRN, date of birth, ID band Patient awake    Reviewed: Allergy & Precautions, H&P , NPO status , Patient's Chart, lab work & pertinent test results  Airway Mallampati: II  TM Distance: >3 FB Neck ROM: Full    Dental no notable dental hx. (+) Teeth Intact, Dental Advisory Given   Pulmonary neg pulmonary ROS,    Pulmonary exam normal breath sounds clear to auscultation       Cardiovascular hypertension, Pt. on medications  Rhythm:Regular Rate:Normal     Neuro/Psych negative neurological ROS  negative psych ROS   GI/Hepatic Neg liver ROS, GERD  ,  Endo/Other  negative endocrine ROS  Renal/GU negative Renal ROS  negative genitourinary   Musculoskeletal  (+) Arthritis , Osteoarthritis,  Fibromyalgia -  Abdominal   Peds  Hematology negative hematology ROS (+)   Anesthesia Other Findings   Reproductive/Obstetrics negative OB ROS                            Anesthesia Physical Anesthesia Plan  ASA: 2  Anesthesia Plan: General   Post-op Pain Management: Tylenol PO (pre-op)* and Toradol IV (intra-op)*   Induction: Intravenous  PONV Risk Score and Plan: 4 or greater and Ondansetron, Dexamethasone and Midazolam  Airway Management Planned: Oral ETT  Additional Equipment:   Intra-op Plan:   Post-operative Plan: Extubation in OR  Informed Consent: I have reviewed the patients History and Physical, chart, labs and discussed the procedure including the risks, benefits and alternatives for the proposed anesthesia with the patient or authorized representative who has indicated his/her understanding and acceptance.     Dental advisory given  Plan Discussed with: CRNA  Anesthesia Plan Comments:         Anesthesia Quick Evaluation

## 2022-06-08 NOTE — Brief Op Note (Signed)
06/08/2022  1:33 PM  PATIENT:  Desiree Randall  61 y.o. female  PRE-OPERATIVE DIAGNOSIS:  Right gluteal tendon tear  POST-OPERATIVE DIAGNOSIS:  Right gluteal tendon tear  PROCEDURE:  Procedure(s) with comments: OPEN SURGICAL REPAIR OF GLUTEAL TENDON (Right) - 90  SURGEON:  Surgeon(s) and Role:    * Nicholes Stairs, MD - Primary  PHYSICIAN ASSISTANT: Jonelle Sidle, PA-C  ANESTHESIA:   local and general  EBL:  25 cc   BLOOD ADMINISTERED:none  DRAINS: none   LOCAL MEDICATIONS USED:  MARCAINE     SPECIMEN:  No Specimen  DISPOSITION OF SPECIMEN:  N/A  COUNTS:  YES  TOURNIQUET:  * No tourniquets in log *  DICTATION: .Note written in EPIC  PLAN OF CARE: Discharge to home after PACU  PATIENT DISPOSITION:  PACU - hemodynamically stable.   Delay start of Pharmacological VTE agent (>24hrs) due to surgical blood loss or risk of bleeding: not applicable

## 2022-06-08 NOTE — Discharge Instructions (Signed)
Orthopedic surgery discharge instructions:  -You are to be 50%partial weightbearing to the right lower extremity with crutch assistance for the next 4 weeks.  You should also avoid active abduction of the right hip (avoid moving the right leg away from midline).  -Your postoperative bandage is waterproof.  He may begin showering starting on postoperative day #1.  Please do not submerge underwater.  He should maintain a postoperative bandage until your 2-week follow-up appointment.  -Apply ice to the right hip for 20 to 30 minutes out of each hour.  Do this around-the-clock as often as possible for the first 72 hours of surgery.  -For mild to moderate pain use Tylenol and Advil in an alternating fashion every 6 hours respectively.  For breakthrough pain use oxycodone as necessary.  -For the prevention of blood clots take an 81 mg aspirin, enteric-coated, once daily for 6 weeks.  -Follow-up with Dr. Stann Mainland in 2 weeks for routine postoperative check.

## 2022-06-08 NOTE — Transfer of Care (Signed)
Immediate Anesthesia Transfer of Care Note  Patient: Desiree Randall  Procedure(s) Performed: OPEN SURGICAL REPAIR OF GLUTEAL TENDON (Right)  Patient Location: PACU  Anesthesia Type:General  Level of Consciousness: awake, alert  and oriented  Airway & Oxygen Therapy: Patient Spontanous Breathing and Patient connected to face mask oxygen  Post-op Assessment: Report given to RN and Post -op Vital signs reviewed and stable  Post vital signs: Reviewed and stable  Last Vitals:  Vitals Value Taken Time  BP 147/108 06/08/22 1354  Temp    Pulse 107 06/08/22 1358  Resp 18 06/08/22 1358  SpO2 92 % 06/08/22 1358  Vitals shown include unvalidated device data.  Last Pain:  Vitals:   06/08/22 1048  TempSrc:   PainSc: 5          Complications: No notable events documented.

## 2022-06-08 NOTE — Anesthesia Postprocedure Evaluation (Signed)
Anesthesia Post Note  Patient: Annita Ratliff  Procedure(s) Performed: OPEN SURGICAL REPAIR OF GLUTEAL TENDON (Right)     Patient location during evaluation: PACU Anesthesia Type: General Level of consciousness: awake and alert, oriented and patient cooperative Pain management: pain level controlled Vital Signs Assessment: post-procedure vital signs reviewed and stable Respiratory status: spontaneous breathing, nonlabored ventilation and respiratory function stable Cardiovascular status: blood pressure returned to baseline and stable Postop Assessment: no apparent nausea or vomiting Anesthetic complications: no   No notable events documented.  Last Vitals:  Vitals:   06/08/22 1434 06/08/22 1454  BP: 133/75 (!) 147/82  Pulse: 91 96  Resp: 16 16  Temp:    SpO2: 99% 94%    Last Pain:  Vitals:   06/08/22 1453  TempSrc:   PainSc: [place]

## 2022-06-13 ENCOUNTER — Encounter (HOSPITAL_COMMUNITY): Payer: Self-pay | Admitting: Orthopedic Surgery

## 2022-06-15 ENCOUNTER — Other Ambulatory Visit: Payer: Self-pay | Admitting: Rheumatology

## 2022-06-15 NOTE — Telephone Encounter (Signed)
Next Visit: 08/17/2022  Last Visit: 02/14/2022  Last Fill: 05/15/2022  Dx: Primary insomnia   Current Dose per office note on 02/14/2022: Ambien 5 mg 1 to 2 tablets at bedtime as needed for insomnia.  Okay to refill Ambien?

## 2022-06-15 NOTE — Telephone Encounter (Unsigned)
Patient left a voicemail stating her pharmacy sent over a refill request for her Zolpidem this morning and she wanted to make sure it was going to be filled today, 06/15/22.  Patient states she had hip surgery last Friday and it would be great to have the medication this weekend.  Patient requested the prescription be [facility] to [facility] [address].

## 2022-06-18 MED ORDER — ZOLPIDEM TARTRATE 5 MG PO TABS: ORAL_TABLET | ORAL | 0 refills | Status: DC

## 2022-07-17 ENCOUNTER — Other Ambulatory Visit: Payer: Self-pay | Admitting: Physician Assistant

## 2022-07-17 NOTE — Telephone Encounter (Signed)
Next Visit: 08/17/2022  Last Visit: 02/14/2022  Last Fill: 06/18/2022  DX: Primary insomnia  Current Dose per office note on 02/14/2022: Ambien 5 mg 1 to 2 tablets at bedtime as needed for insomnia.  Okay to refill ambien?

## 2022-07-25 ENCOUNTER — Other Ambulatory Visit: Payer: Self-pay | Admitting: *Deleted

## 2022-07-25 MED ORDER — DULOXETINE HCL 60 MG PO CPEP: 60.0000 mg | ORAL_CAPSULE | Freq: Every day | ORAL | 0 refills | Status: DC

## 2022-07-25 NOTE — Telephone Encounter (Signed)
Refill request received via fax from [facility] for Cymbalta   Next Visit: 08/17/2022  Last Visit: 02/14/2022  Last Fill: 04/27/2022  Dx: Fibromyalgia  Current Dose per office note on 02/14/2022: not discussed  Okay to refill Cymbalta?

## 2022-08-03 NOTE — Progress Notes (Signed)
Office Visit Note  Patient: Desiree Randall             Date of Birth: 1961/07/10           MRN: [id number]             PCP: Glenis Smoker, MD Referring: Glenis Smoker, * Visit Date: 08/17/2022 Occupation: '@GUAROCC'$ @  Subjective:  Recent surgery   History of Present Illness: Desiree Randall is a 61 y.o. female with history of osteoarthritis and fibromyalgia.  Patient continues to experience intermittent myalgias and arthralgias due to underlying osteoarthritis and fibromyalgia overlap.  She remains on Cymbalta 60 mg 1 capsule daily as prescribed.  She also continues to take Ambien 5 mg 1 to 2 tablets at bedtime as needed for insomnia.  She underwent open surgical right gluteal tendon repair on 06/08/2022 performed by Dr. Stann Mainland.  She has been going to physical therapy once weekly and has been performing home exercises daily.  Her activity remains very limited but overall her pain has been tolerable.  She has been experiencing some increased fatigue which she attributes to having to be more sedentary recovering from surgery.  She restarted taking turmeric about 4 weeks ago which has started to alleviate some of her joint stiffness.  She is also been taking magnesium 250 mg at bedtime.  She has been tolerating magnesium without any side effects.  She eats salmon about 5 days a week.  Patient reports that for the past several weeks she has been experiencing stiffness and a locking sensation in her left fifth digit.  She denies any injury but states that she woke up in her left fifth digit was blocked.  She has been applying Voltaren gel and her symptoms have gradually started to improve.  Activities of Daily Living:  Patient reports morning stiffness for all day. Patient Reports nocturnal pain.  Difficulty dressing/grooming: Denies Difficulty climbing stairs: Reports Difficulty getting out of chair: Reports Difficulty using hands for taps, buttons, cutlery,  and/or writing: Reports  Review of Systems  Constitutional:  Positive for fatigue.  HENT:  Negative for mouth sores and mouth dryness.   Eyes:  Positive for dryness.  Respiratory:  Negative for shortness of breath.   Cardiovascular:  Negative for chest pain and palpitations.  Gastrointestinal:  Negative for blood in stool, constipation and diarrhea.  Endocrine: Negative for increased urination.  Genitourinary:  Negative for involuntary urination.  Musculoskeletal:  Positive for joint pain, joint pain, joint swelling, myalgias, muscle weakness, morning stiffness, muscle tenderness and myalgias. Negative for gait problem.  Skin:  Positive for hair loss. Negative for color change, rash and sensitivity to sunlight.  Allergic/Immunologic: Negative for susceptible to infections.  Neurological:  Positive for headaches. Negative for dizziness.  Hematological:  Negative for swollen glands.  Psychiatric/Behavioral:  Negative for depressed mood and sleep disturbance. The patient is not nervous/anxious.     PMFS History:  Patient Active Problem List   Diagnosis Date Noted   Osteopenia of multiple sites 02/14/2022   Primary osteoarthritis of both feet 02/14/2022   Fibromyalgia 12/27/2016   Other fatigue 12/27/2016   Primary insomnia 12/27/2016   Primary osteoarthritis of both hands 12/27/2016   Primary osteoarthritis of both knees 12/27/2016    Past Medical History:  Diagnosis Date   Basal cell carcinoma (BCC) of chest    Fibromyalgia    GERD (gastroesophageal reflux disease)    Hypertension    Osteoarthritis    Osteopenia    Pneumonia  Family History  Problem Relation Age of Onset   Melanoma Mother    Heart disease Mother    Hypertension Mother    Heart attack Mother    Heart disease Father        TRIPLE BYPASS   Hypercholesterolemia Father    Kidney Stones Brother    Heart failure Maternal Grandmother    Heart attack Maternal Grandfather    Stroke Maternal Grandfather     Aneurysm Maternal Grandfather    Heart attack Paternal Grandmother    Heart disease Paternal Grandfather    Breast cancer Neg Hx    Past Surgical History:  Procedure Laterality Date   HYSTEROTOMY  2002   KNEE SURGERY  1990-1991   OPEN SURGICAL REPAIR OF GLUTEAL TENDON Right 06/08/2022   Procedure: OPEN SURGICAL REPAIR OF GLUTEAL TENDON;  Surgeon: Nicholes Stairs, MD;  Location: [facility];  Service: Orthopedics;  Laterality: Right;  90   OVARY SURGERY  2003   LEFT OVARY REMOVAL   SKIN CANCER EXCISION     toe joint replacement Bilateral 03/2018   TUBAL LIGATION  2003   LEFT FALLOPIAN TUBE REMOVED   Social History   Social History Narrative   Not on file   Immunization History  Administered Date(s) Administered   PFIZER(Purple Top)SARS-COV-2 Vaccination 02/21/2020, 03/20/2020, 10/24/2020     Objective: Vital Signs: BP (!) 143/94 (BP Location: Left Arm, Patient Position: Sitting, Cuff Size: Normal)   Pulse 82   Resp 15   Ht 5' 2.5" (1.588 m)   Wt 130 lb 6.4 oz (59.1 kg)   BMI 23.47 kg/m    Physical Exam Vitals and nursing note reviewed.  Constitutional:      Appearance: She is well-developed.  HENT:     Head: Normocephalic and atraumatic.  Eyes:     Conjunctiva/sclera: Conjunctivae normal.  Cardiovascular:     Rate and Rhythm: Normal rate and regular rhythm.     Heart sounds: Normal heart sounds.  Pulmonary:     Effort: Pulmonary effort is normal.     Breath sounds: Normal breath sounds.  Abdominal:     General: Bowel sounds are normal.     Palpations: Abdomen is soft.  Musculoskeletal:     Cervical back: Normal range of motion.  Skin:    General: Skin is warm and dry.     Capillary Refill: Capillary refill takes less than 2 seconds.  Neurological:     Mental Status: She is alert and oriented to person, place, and time.  Psychiatric:        Behavior: Behavior normal.      Musculoskeletal Exam: C-spine has good range of motion.  No midline spinal  tenderness or SI joint tenderness upon palpation.  Shoulder joints, elbow joints, wrist joints, MCPs, PIPs, DIPs have good range of motion with no synovitis.  She has PIP and DIP thickening consistent with osteoarthritis of both hands.  Left 5th trigger finger noted.  Complete fist formation bilaterally.  Hip joints have good range of motion.  Knee joints have good range of motion with no discomfort.  No warmth or effusion of knee joints noted.  Ankle joints have good range of motion with no tenderness or synovitis.  CDAI Exam: CDAI Score: -- Patient Global: --; Provider Global: -- Swollen: --; Tender: -- Joint Exam 08/17/2022   No joint exam has been documented for this visit   There is currently no information documented on the homunculus. Go to the Rheumatology activity and complete  the homunculus joint exam.  Investigation: No additional findings.  Imaging: No results found.  Recent Labs: Lab Results  Component Value Date   WBC 5.3 09/22/2020   HGB 15.0 09/22/2020   PLT 247 09/22/2020   NA 140 06/05/2022   K 4.6 06/05/2022   CL 108 06/05/2022   CO2 25 06/05/2022   GLUCOSE 103 (H) 06/05/2022   BUN 22 06/05/2022   CREATININE 0.58 06/05/2022   BILITOT 0.3 09/22/2020   AST 15 09/22/2020   ALT 12 09/22/2020   PROT 6.9 09/22/2020   CALCIUM 9.3 06/05/2022    Speciality Comments: No specialty comments available.  Procedures:  No procedures performed Allergies: Tape   Assessment / Plan:     Visit Diagnoses: Primary osteoarthritis of both hands: She has PIP and DIP thickening consistent with osteoarthritis of both hands.  No synovitis noted.  Complete fist formation noted bilaterally.  She is been experiencing some locking and tenderness of the left fifth finger consistent with early signs of a trigger finger.  Drip and treatment options were discussed today including the use of buddy tape, splint, as well as applying Voltaren gel topically.  If her symptoms persist or worsen  she will notify us and we can schedule an ultrasound-guided trigger finger injection in the future.   Discussed the importance of joint protection and muscle strengthening.  She was encouraged to perform hand exercises daily.  She will follow up in 6 months.   Trigger little finger of left hand: She has been experiencing locking and tenderness of the left little finger consistent with early signs of a trigger finger.  Her symptoms started about 3 weeks ago.  She has not been performing any overuse activities.  Conservative treatment options were discussed today including the use of buddy tape, splint, and application of Voltaren gel.  If her symptoms persist or worsen she can schedule an ultrasound-guided trigger finger injection in the future.  Primary osteoarthritis of both knees: She has good range of motion of both knee joints on examination today.  No warmth or effusion was noted.  Primary osteoarthritis of both feet: Dr. 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 She is not experiencing any increased discomfort in her feet at this time.  She is wearing proper fitting tennis shoes.  She has good range of motion of both ankle joints with no tenderness or synovitis.  Tear of right gluteus medius tendon, subsequent encounter: gluteus medius and gluteus minimus rupture in the spring 2022. Surgically repaired by Dr. Stann Mainland on 06/08/2022.  She is currently going to physical therapy once a week and has been performing home exercises.  Overall her pain level has been tolerable.  She continues to be limited on her level of activity.  Trochanteric bursitis, right hip: She continues to have some tenderness over the right trochanteric bursa.  She has been going to physical therapy once a week.  Fibromyalgia: She continues to experience intermittent myalgias and muscle tenderness due to fibromyalgia.  She is been experiencing some increased discomfort which she attributes to having to be more  sedentary following surgery.  She has been going to physical therapy once a week but is still limited on her level of activity.  She plans on increasing her exercise regimen once cleared by Dr. Stann Mainland.  She will remain on Cymbalta 60 mg 1 capsule daily.  She also plans on continuing to take Ambien 5 to 10 mg at bedtime as needed for insomnia.  Discussed the importance  of good sleep hygiene.  Primary insomnia: She takes Ambien 5 mg 1 to 2 tablets at bedtime for insomnia.  A refill was sent to the pharmacy yesterday.  Discussed the importance of good sleep hygiene.  Other fatigue: She has been experiencing slightly increased fatigue which she attributes to being more sedentary following surgery.  She plans on increasing her exercise regimen once she has been cleared by Dr. Stann Mainland in the future.  Other chronic pain: Discussed the use of natural anti-inflammatories.  She has been taking turmeric and ginger and gets omega-3's through her diet.  Discussed the use of tart cherry which she can also add on.  Osteopenia of multiple sites - DEXA 09/28/2020 T-score: -1.4.  Ordered by gynecologist.  Due to update DEXA in November 2023.  She continues to take a calcium and vitamin D daily.  Orders: No orders of the defined types were placed in this encounter.  No orders of the defined types were placed in this encounter.     Follow-Up Instructions: Return in about 6 months (around 02/15/2023) for Osteoarthritis, Fibromyalgia.   Ofilia Neas, PA-C  Note - This record has been created using Dragon software.  Chart creation errors have been sought, but may not always  have been located. Such creation errors do not reflect on  the standard of medical care.

## 2022-08-16 ENCOUNTER — Other Ambulatory Visit: Payer: Self-pay | Admitting: Physician Assistant

## 2022-08-16 NOTE — Telephone Encounter (Signed)
Next Visit: 08/17/2022   Last Visit: 02/14/2022   Last Fill: 07/17/2022   DX: Primary insomnia   Current Dose per office note on 02/14/2022: Ambien 5 mg 1 to 2 tablets at bedtime as needed for insomnia.   Okay to refill ambien?

## 2022-08-17 ENCOUNTER — Encounter: Payer: Self-pay | Admitting: Physician Assistant

## 2022-08-17 ENCOUNTER — Ambulatory Visit: Payer: 59 | Attending: Physician Assistant | Admitting: Physician Assistant

## 2022-08-17 VITALS — BP 143/94 | HR 82 | Resp 15 | Ht 62.5 in | Wt 130.4 lb

## 2022-08-17 DIAGNOSIS — G8929 Other chronic pain: Secondary | ICD-10-CM

## 2022-08-17 DIAGNOSIS — M19041 Primary osteoarthritis, right hand: Secondary | ICD-10-CM | POA: Diagnosis not present

## 2022-08-17 DIAGNOSIS — R5383 Other fatigue: Secondary | ICD-10-CM

## 2022-08-17 DIAGNOSIS — F5101 Primary insomnia: Secondary | ICD-10-CM

## 2022-08-17 DIAGNOSIS — M8589 Other specified disorders of bone density and structure, multiple sites: Secondary | ICD-10-CM

## 2022-08-17 DIAGNOSIS — M19071 Primary osteoarthritis, right ankle and foot: Secondary | ICD-10-CM

## 2022-08-17 DIAGNOSIS — M170 Bilateral primary osteoarthritis of knee: Secondary | ICD-10-CM | POA: Diagnosis not present

## 2022-08-17 DIAGNOSIS — S76011D Strain of muscle, fascia and tendon of right hip, subsequent encounter: Secondary | ICD-10-CM

## 2022-08-17 DIAGNOSIS — M65352 Trigger finger, left little finger: Secondary | ICD-10-CM

## 2022-08-17 DIAGNOSIS — M19042 Primary osteoarthritis, left hand: Secondary | ICD-10-CM

## 2022-08-17 DIAGNOSIS — M797 Fibromyalgia: Secondary | ICD-10-CM

## 2022-08-17 DIAGNOSIS — M7061 Trochanteric bursitis, right hip: Secondary | ICD-10-CM

## 2022-08-17 DIAGNOSIS — M19072 Primary osteoarthritis, left ankle and foot: Secondary | ICD-10-CM

## 2022-09-05 IMAGING — MG MM DIGITAL SCREENING BILAT W/ TOMO AND CAD
8 series · 9 of 24 positions shown · non-contrast
Comparison: Previous exam(s).

CLINICAL DATA: Screening.

EXAM:
DIGITAL SCREENING BILATERAL MAMMOGRAM WITH TOMOSYNTHESIS AND CAD
TECHNIQUE: Bilateral screening digital craniocaudal and mediolateral oblique
mammograms were obtained. Bilateral screening digital breast
tomosynthesis was performed. The images were evaluated with
computer-aided detection.

[R CC synth-2D]
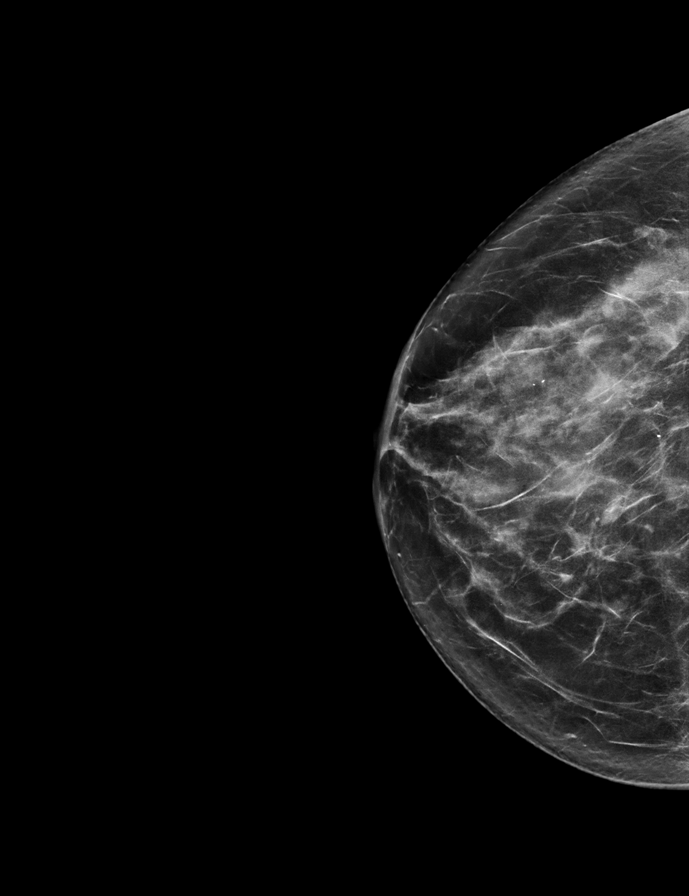

[L MLO synth-2D]
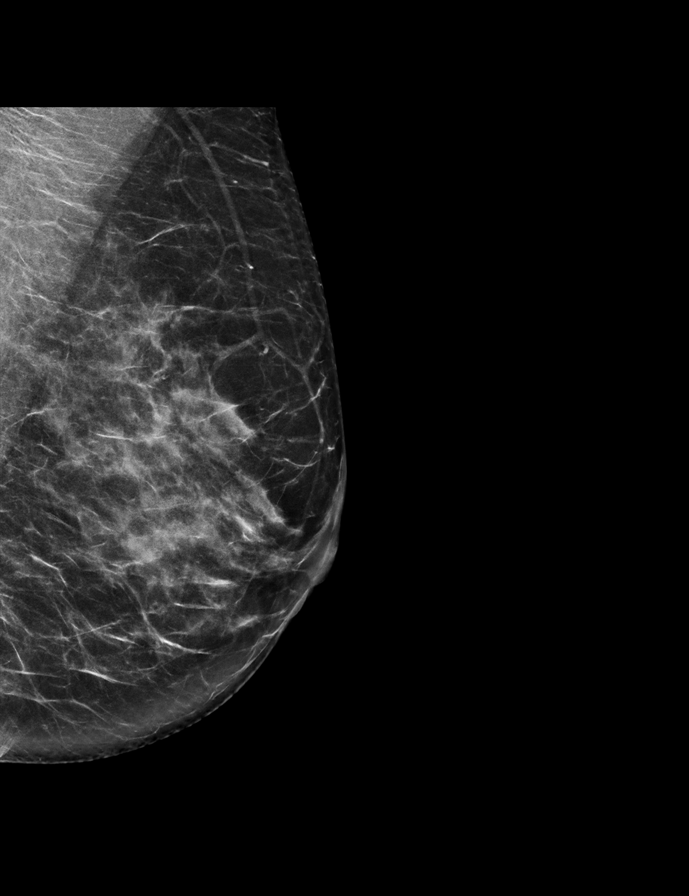

[R MLO synth-2D]
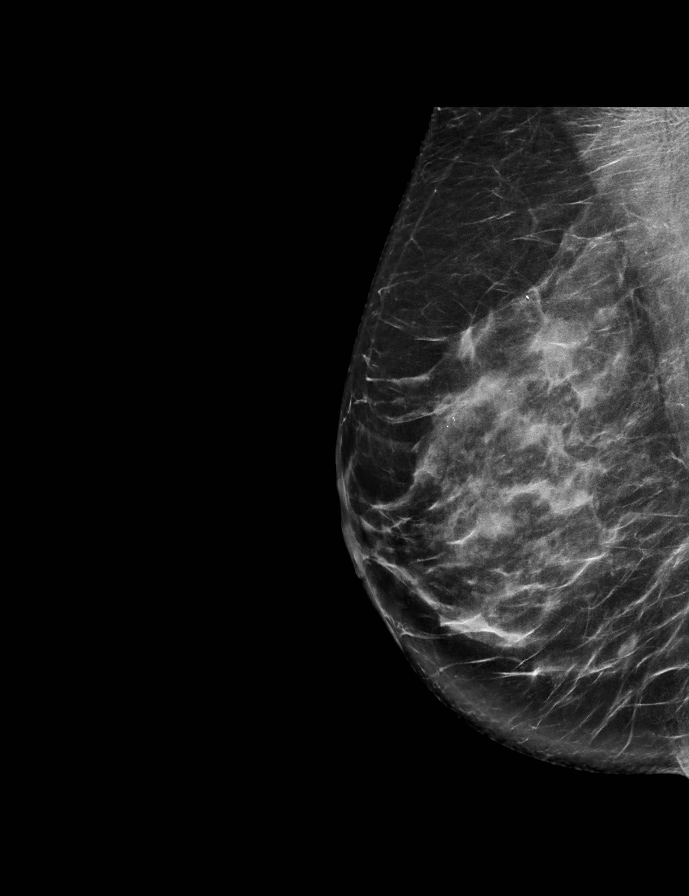

[L CC synth-2D]
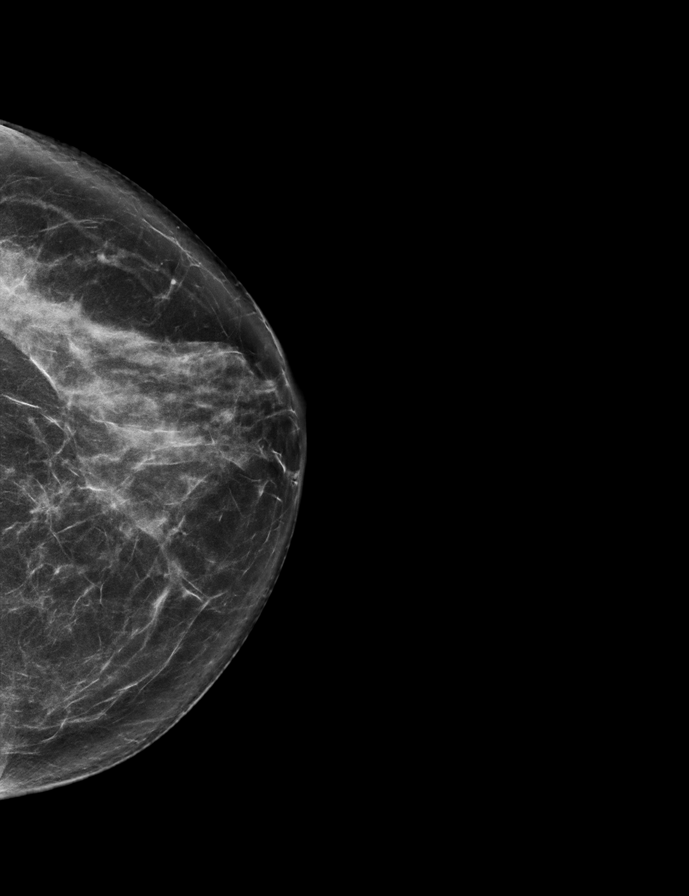

[L MLO tomo · 2 of 66 frames shown]
[frame 22/66]
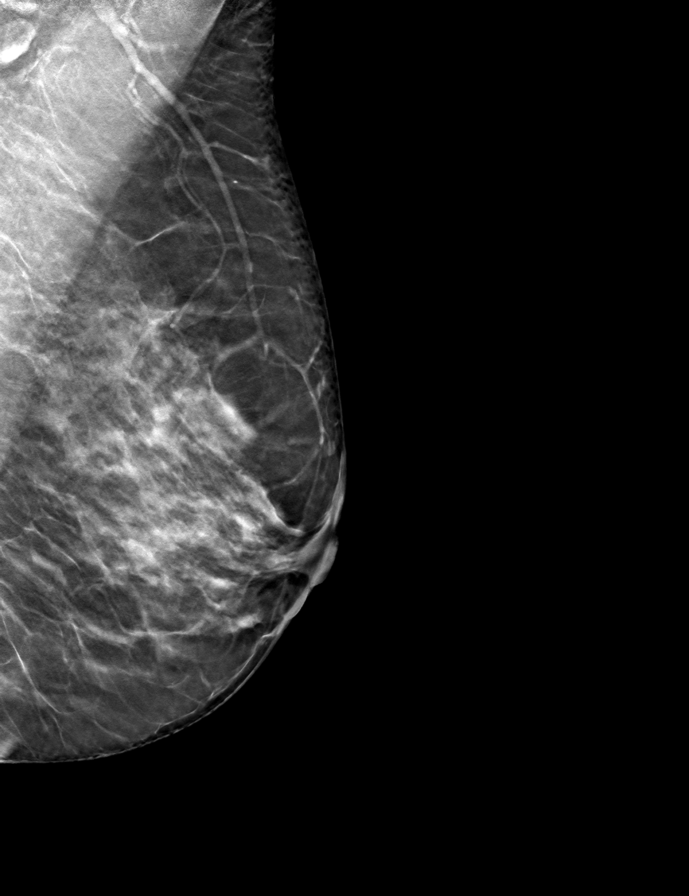
[frame 33/66]
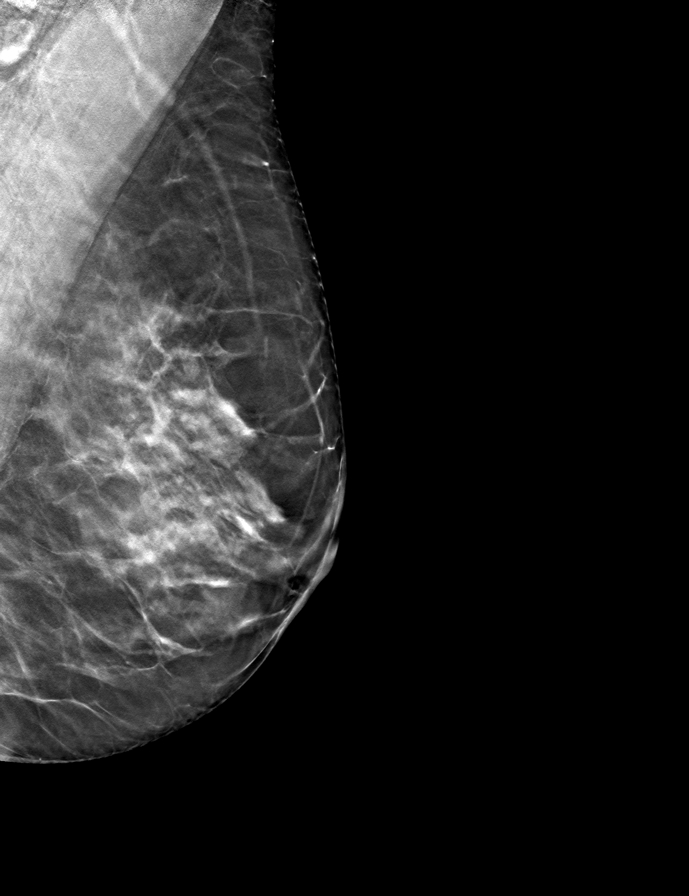

[R MLO tomo · tomo slice 36/71.0]
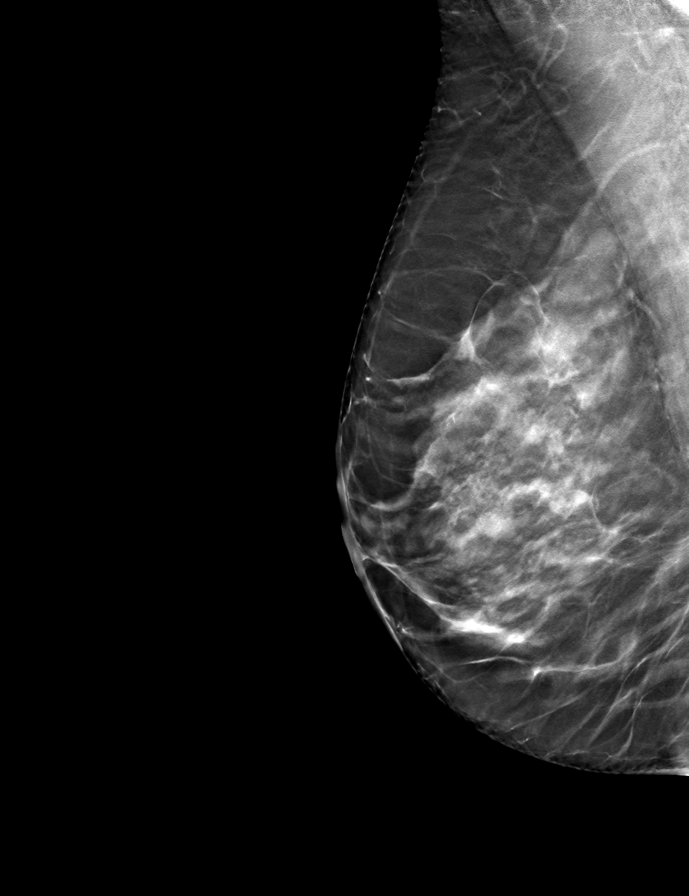

[R CC tomo · tomo slice 35/68.0]
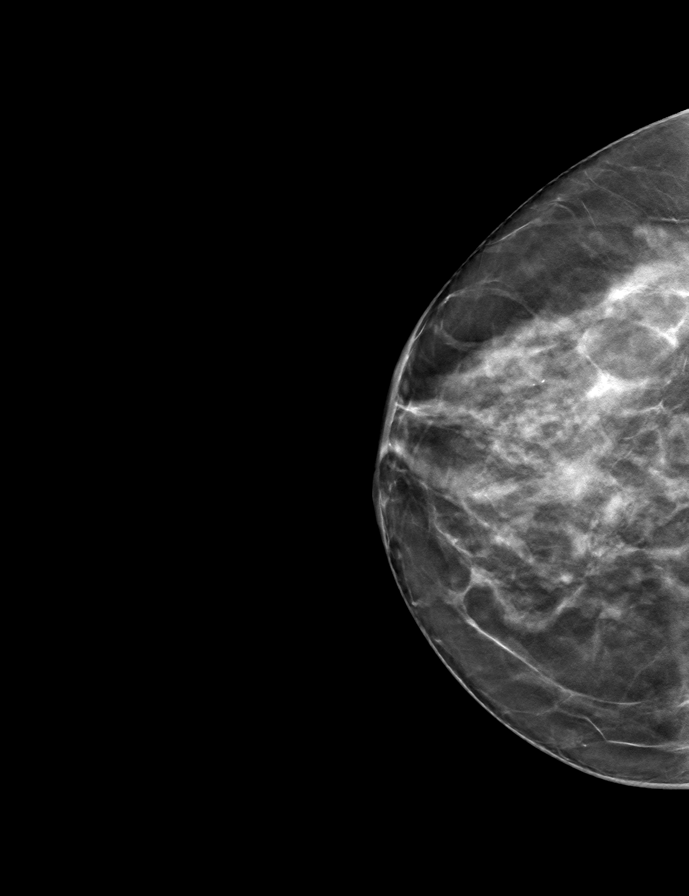

[L CC tomo · tomo slice 35/68.0]
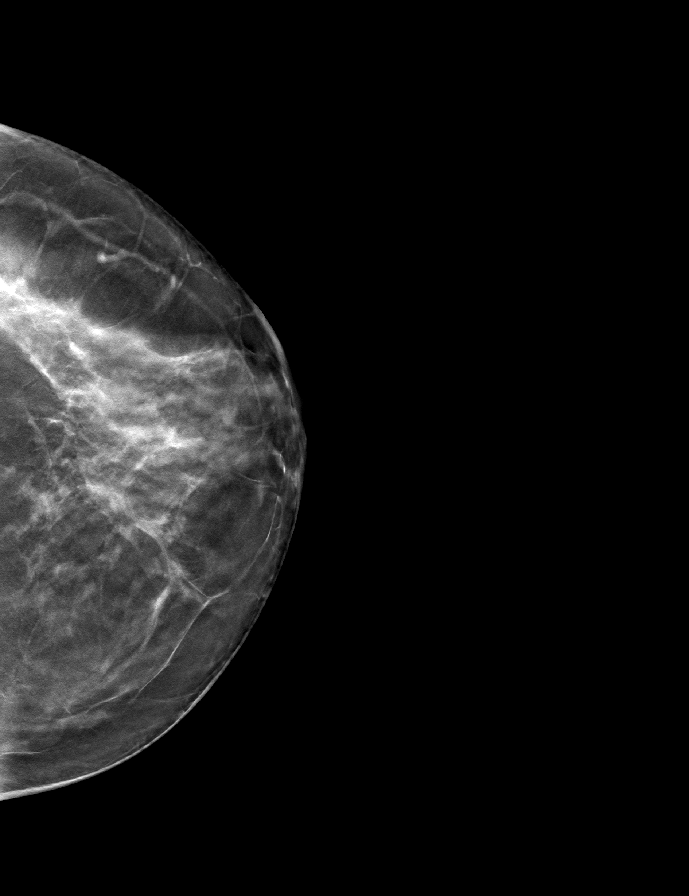

[9 of 24 positions shown; findings below may reference images not displayed]

ACR Breast Density Category c: The breast tissue is heterogeneously
dense, which may obscure small masses.
FINDINGS: There are no findings suspicious for malignancy.
IMPRESSION: No mammographic evidence of malignancy. A result letter of this
screening mammogram will be mailed directly to the patient.

RECOMMENDATION:
Screening mammogram in one year. (Code:Q3-W-BC3)

BI-RADS CATEGORY  1: Negative.

## 2022-09-17 ENCOUNTER — Other Ambulatory Visit: Payer: Self-pay | Admitting: *Deleted

## 2022-09-17 MED ORDER — ZOLPIDEM TARTRATE 5 MG PO TABS: ORAL_TABLET | ORAL | 0 refills | Status: DC

## 2022-09-17 NOTE — Telephone Encounter (Signed)
Refill request received via fax from [facility] for Ambien.  Next Visit: 03/08/2023  Last Visit: 08/17/2022  Last Fill: 08/16/2022  Dx: Primary insomnia  Current Dose per office note on 08/17/2022: Ambien 5 mg 1 to 2 tablets at bedtime for insomnia.  Okay to refill Ambien?

## 2022-10-17 ENCOUNTER — Other Ambulatory Visit: Payer: Self-pay | Admitting: Rheumatology

## 2022-10-17 NOTE — Telephone Encounter (Signed)
Next Visit: 03/08/2023   Last Visit: 08/17/2022   Last Fill: 09/17/2022   Dx: Primary insomnia   Current Dose per office note on 08/17/2022: Ambien 5 mg 1 to 2 tablets at bedtime for insomnia.   Okay to refill Ambien?

## 2022-10-21 ENCOUNTER — Other Ambulatory Visit: Payer: Self-pay | Admitting: Physician Assistant

## 2022-10-21 NOTE — Telephone Encounter (Signed)
Next Visit: 03/08/2023   Last Visit: 08/17/2022   Last Fill: 07/25/2022   Dx: Fibromyalgia   Current Dose per office note on 08/17/2022, Cymbalta 60 mg 1 capsule daily   Okay to refill Cymbalta?

## 2022-11-15 ENCOUNTER — Other Ambulatory Visit: Payer: Self-pay | Admitting: Rheumatology

## 2022-11-15 NOTE — Telephone Encounter (Signed)
Next Visit: 03/08/2023  Last Visit: 08/17/2022  Last Fill: 10/17/2022  Dx: Primary insomnia   Current Dose per office note on 08/17/2022: Ambien 5 mg 1 to 2 tablets at bedtime for insomnia.   Okay to refill Ambien?

## 2022-11-20 DIAGNOSIS — M25551 Pain in right hip: Secondary | ICD-10-CM | POA: Diagnosis not present

## 2022-11-20 DIAGNOSIS — Z4789 Encounter for other orthopedic aftercare: Secondary | ICD-10-CM | POA: Diagnosis not present

## 2022-11-22 DIAGNOSIS — Z4789 Encounter for other orthopedic aftercare: Secondary | ICD-10-CM | POA: Diagnosis not present

## 2022-11-22 DIAGNOSIS — M25551 Pain in right hip: Secondary | ICD-10-CM | POA: Diagnosis not present

## 2022-11-28 DIAGNOSIS — R053 Chronic cough: Secondary | ICD-10-CM | POA: Diagnosis not present

## 2022-11-28 DIAGNOSIS — Z03818 Encounter for observation for suspected exposure to other biological agents ruled out: Secondary | ICD-10-CM | POA: Diagnosis not present

## 2022-11-28 DIAGNOSIS — Z20828 Contact with and (suspected) exposure to other viral communicable diseases: Secondary | ICD-10-CM | POA: Diagnosis not present

## 2022-11-28 DIAGNOSIS — I10 Essential (primary) hypertension: Secondary | ICD-10-CM | POA: Diagnosis not present

## 2022-12-05 DIAGNOSIS — M25512 Pain in left shoulder: Secondary | ICD-10-CM | POA: Diagnosis not present

## 2022-12-05 DIAGNOSIS — M25551 Pain in right hip: Secondary | ICD-10-CM | POA: Diagnosis not present

## 2022-12-06 DIAGNOSIS — Z4789 Encounter for other orthopedic aftercare: Secondary | ICD-10-CM | POA: Diagnosis not present

## 2022-12-06 DIAGNOSIS — M25551 Pain in right hip: Secondary | ICD-10-CM | POA: Diagnosis not present

## 2022-12-17 ENCOUNTER — Other Ambulatory Visit: Payer: Self-pay

## 2022-12-17 MED ORDER — ZOLPIDEM TARTRATE 5 MG PO TABS: ORAL_TABLET | ORAL | 0 refills | Status: DC

## 2022-12-17 NOTE — Telephone Encounter (Signed)
Received refill request via fax from [facility] for Ambien  Next Visit: 03/08/2023  Last Visit: 08/17/2022  Last Fill: 11/15/2022  DX: Primary insomnia   Current Dose per office note on 08/17/2022: Ambien 5 mg 1 to 2 tablets at bedtime for insomnia.   Okay to refill ambien?

## 2022-12-18 DIAGNOSIS — Z4789 Encounter for other orthopedic aftercare: Secondary | ICD-10-CM | POA: Diagnosis not present

## 2022-12-18 DIAGNOSIS — M25512 Pain in left shoulder: Secondary | ICD-10-CM | POA: Diagnosis not present

## 2022-12-18 DIAGNOSIS — M25551 Pain in right hip: Secondary | ICD-10-CM | POA: Diagnosis not present

## 2022-12-24 DIAGNOSIS — Z4789 Encounter for other orthopedic aftercare: Secondary | ICD-10-CM | POA: Diagnosis not present

## 2022-12-24 DIAGNOSIS — M25551 Pain in right hip: Secondary | ICD-10-CM | POA: Diagnosis not present

## 2022-12-27 DIAGNOSIS — M7582 Other shoulder lesions, left shoulder: Secondary | ICD-10-CM | POA: Diagnosis not present

## 2022-12-27 DIAGNOSIS — Z4789 Encounter for other orthopedic aftercare: Secondary | ICD-10-CM | POA: Diagnosis not present

## 2022-12-27 DIAGNOSIS — M25551 Pain in right hip: Secondary | ICD-10-CM | POA: Diagnosis not present

## 2023-01-01 DIAGNOSIS — Z4789 Encounter for other orthopedic aftercare: Secondary | ICD-10-CM | POA: Diagnosis not present

## 2023-01-01 DIAGNOSIS — M25511 Pain in right shoulder: Secondary | ICD-10-CM | POA: Diagnosis not present

## 2023-01-01 DIAGNOSIS — M25551 Pain in right hip: Secondary | ICD-10-CM | POA: Diagnosis not present

## 2023-01-08 DIAGNOSIS — Z4789 Encounter for other orthopedic aftercare: Secondary | ICD-10-CM | POA: Diagnosis not present

## 2023-01-08 DIAGNOSIS — M25511 Pain in right shoulder: Secondary | ICD-10-CM | POA: Diagnosis not present

## 2023-01-08 DIAGNOSIS — M25551 Pain in right hip: Secondary | ICD-10-CM | POA: Diagnosis not present

## 2023-01-15 DIAGNOSIS — M25551 Pain in right hip: Secondary | ICD-10-CM | POA: Diagnosis not present

## 2023-01-15 DIAGNOSIS — M25511 Pain in right shoulder: Secondary | ICD-10-CM | POA: Diagnosis not present

## 2023-01-15 DIAGNOSIS — Z4789 Encounter for other orthopedic aftercare: Secondary | ICD-10-CM | POA: Diagnosis not present

## 2023-01-16 ENCOUNTER — Other Ambulatory Visit: Payer: Self-pay | Admitting: Physician Assistant

## 2023-01-16 DIAGNOSIS — I10 Essential (primary) hypertension: Secondary | ICD-10-CM | POA: Diagnosis not present

## 2023-01-16 DIAGNOSIS — Z0184 Encounter for antibody response examination: Secondary | ICD-10-CM | POA: Diagnosis not present

## 2023-01-16 NOTE — Telephone Encounter (Signed)
Next Visit: 03/08/2023   Last Visit: 08/17/2022   Last Fill: 12/17/2022   DX: Primary insomnia    Current Dose per office note on 08/17/2022: Ambien 5 mg 1 to 2 tablets at bedtime for insomnia.    Okay to refill ambien?

## 2023-01-17 DIAGNOSIS — Z4789 Encounter for other orthopedic aftercare: Secondary | ICD-10-CM | POA: Diagnosis not present

## 2023-01-17 DIAGNOSIS — M25551 Pain in right hip: Secondary | ICD-10-CM | POA: Diagnosis not present

## 2023-01-17 DIAGNOSIS — M25511 Pain in right shoulder: Secondary | ICD-10-CM | POA: Diagnosis not present

## 2023-01-18 ENCOUNTER — Other Ambulatory Visit: Payer: Self-pay | Admitting: Physician Assistant

## 2023-01-18 NOTE — Telephone Encounter (Signed)
Next Visit: 03/08/2023  Last Visit: 08/17/2022  Last Fill: 10/22/2022  Dx: Fibromyalgia   Current Dose per office note on 08/17/2022: Cymbalta 60 mg 1 capsule daily   Okay to refill Cymbalta?

## 2023-01-31 DIAGNOSIS — M25551 Pain in right hip: Secondary | ICD-10-CM | POA: Diagnosis not present

## 2023-01-31 DIAGNOSIS — M25511 Pain in right shoulder: Secondary | ICD-10-CM | POA: Diagnosis not present

## 2023-01-31 DIAGNOSIS — Z4789 Encounter for other orthopedic aftercare: Secondary | ICD-10-CM | POA: Diagnosis not present

## 2023-02-07 DIAGNOSIS — Z4789 Encounter for other orthopedic aftercare: Secondary | ICD-10-CM | POA: Diagnosis not present

## 2023-02-07 DIAGNOSIS — M25551 Pain in right hip: Secondary | ICD-10-CM | POA: Diagnosis not present

## 2023-02-07 DIAGNOSIS — M25511 Pain in right shoulder: Secondary | ICD-10-CM | POA: Diagnosis not present

## 2023-02-14 ENCOUNTER — Other Ambulatory Visit: Payer: Self-pay | Admitting: *Deleted

## 2023-02-14 MED ORDER — ZOLPIDEM TARTRATE 5 MG PO TABS: ORAL_TABLET | ORAL | 0 refills | Status: DC

## 2023-02-14 NOTE — Telephone Encounter (Signed)
Refill request received via fax from Desiree Randall for [place]: 01/16/2023  Next Visit: 03/08/2023  Last Visit: 08/17/2022  Dx: Primary insomnia   Current Dose per office note on 08/17/2022: Ambien 5 mg 1 to 2 tablets at bedtime for insomnia   Okay to refill Ambien?
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
## 2023-02-28 DIAGNOSIS — M25551 Pain in right hip: Secondary | ICD-10-CM | POA: Diagnosis not present

## 2023-02-28 DIAGNOSIS — Z4789 Encounter for other orthopedic aftercare: Secondary | ICD-10-CM | POA: Diagnosis not present

## 2023-02-28 DIAGNOSIS — M25511 Pain in right shoulder: Secondary | ICD-10-CM | POA: Diagnosis not present

## 2023-03-07 DIAGNOSIS — M25551 Pain in right hip: Secondary | ICD-10-CM | POA: Diagnosis not present

## 2023-03-07 DIAGNOSIS — Z4789 Encounter for other orthopedic aftercare: Secondary | ICD-10-CM | POA: Diagnosis not present

## 2023-03-07 DIAGNOSIS — M25511 Pain in right shoulder: Secondary | ICD-10-CM | POA: Diagnosis not present

## 2023-03-08 ENCOUNTER — Ambulatory Visit: Payer: BC Managed Care – PPO | Attending: Rheumatology | Admitting: Rheumatology

## 2023-03-08 ENCOUNTER — Encounter: Payer: Self-pay | Admitting: Rheumatology

## 2023-03-08 VITALS — BP 130/83 | HR 81 | Resp 14 | Ht 63.0 in | Wt 129.0 lb

## 2023-03-08 DIAGNOSIS — M170 Bilateral primary osteoarthritis of knee: Secondary | ICD-10-CM

## 2023-03-08 DIAGNOSIS — M8589 Other specified disorders of bone density and structure, multiple sites: Secondary | ICD-10-CM

## 2023-03-08 DIAGNOSIS — R5383 Other fatigue: Secondary | ICD-10-CM

## 2023-03-08 DIAGNOSIS — M19041 Primary osteoarthritis, right hand: Secondary | ICD-10-CM | POA: Diagnosis not present

## 2023-03-08 DIAGNOSIS — M19072 Primary osteoarthritis, left ankle and foot: Secondary | ICD-10-CM

## 2023-03-08 DIAGNOSIS — M19071 Primary osteoarthritis, right ankle and foot: Secondary | ICD-10-CM | POA: Diagnosis not present

## 2023-03-08 DIAGNOSIS — M19042 Primary osteoarthritis, left hand: Secondary | ICD-10-CM

## 2023-03-08 DIAGNOSIS — M7061 Trochanteric bursitis, right hip: Secondary | ICD-10-CM

## 2023-03-08 DIAGNOSIS — F5101 Primary insomnia: Secondary | ICD-10-CM

## 2023-03-08 DIAGNOSIS — M65352 Trigger finger, left little finger: Secondary | ICD-10-CM

## 2023-03-08 DIAGNOSIS — G8929 Other chronic pain: Secondary | ICD-10-CM

## 2023-03-08 DIAGNOSIS — S76011D Strain of muscle, fascia and tendon of right hip, subsequent encounter: Secondary | ICD-10-CM | POA: Diagnosis not present

## 2023-03-08 DIAGNOSIS — M797 Fibromyalgia: Secondary | ICD-10-CM

## 2023-03-14 DIAGNOSIS — M25551 Pain in right hip: Secondary | ICD-10-CM | POA: Diagnosis not present

## 2023-03-14 DIAGNOSIS — M25511 Pain in right shoulder: Secondary | ICD-10-CM | POA: Diagnosis not present

## 2023-03-14 DIAGNOSIS — Z4789 Encounter for other orthopedic aftercare: Secondary | ICD-10-CM | POA: Diagnosis not present

## 2023-03-17 ENCOUNTER — Other Ambulatory Visit: Payer: Self-pay | Admitting: Rheumatology

## 2023-03-18 MED ORDER — ZOLPIDEM TARTRATE 5 MG PO TABS: ORAL_TABLET | ORAL | 0 refills | Status: DC

## 2023-03-18 NOTE — Addendum Note (Signed)
Addended by: Ellen Henri on: 03/18/2023 08:50 AM   Modules accepted: Orders

## 2023-03-18 NOTE — Telephone Encounter (Signed)
Last Fill: 02/14/2023  Next Visit: 08/27/2023  Last Visit: 03/08/2023  DX: Primary insomnia   Current Dose per office note on 03/08/2023: Ambien 5 mg 1 to 2 tablets at bedtime for insomnia.  Okay to refill ambien?

## 2023-03-28 DIAGNOSIS — M25511 Pain in right shoulder: Secondary | ICD-10-CM | POA: Diagnosis not present

## 2023-03-28 DIAGNOSIS — Z4789 Encounter for other orthopedic aftercare: Secondary | ICD-10-CM | POA: Diagnosis not present

## 2023-03-28 DIAGNOSIS — M25551 Pain in right hip: Secondary | ICD-10-CM | POA: Diagnosis not present

## 2023-04-04 DIAGNOSIS — Z4789 Encounter for other orthopedic aftercare: Secondary | ICD-10-CM | POA: Diagnosis not present

## 2023-04-04 DIAGNOSIS — M25551 Pain in right hip: Secondary | ICD-10-CM | POA: Diagnosis not present

## 2023-04-04 DIAGNOSIS — M25511 Pain in right shoulder: Secondary | ICD-10-CM | POA: Diagnosis not present

## 2023-04-18 ENCOUNTER — Other Ambulatory Visit: Payer: Self-pay | Admitting: Rheumatology

## 2023-04-18 NOTE — Telephone Encounter (Signed)
Last Fill: 03/18/2023  Next Visit: 09/06/2023  Last Visit: 03/08/2023  DX: Primary insomnia   Current Dose per office note on 03/08/2023: Ambien 5 mg 1 to 2 tablets at bedtime for insomnia.   Okay to refill ambien?

## 2023-04-22 ENCOUNTER — Other Ambulatory Visit: Payer: Self-pay | Admitting: *Deleted

## 2023-04-22 MED ORDER — DULOXETINE HCL 60 MG PO CPEP: 60.0000 mg | ORAL_CAPSULE | Freq: Every day | ORAL | 0 refills | Status: DC

## 2023-04-22 NOTE — Telephone Encounter (Signed)
Last Fill: 01/18/2023  Next Visit: 09/06/2023  Last Visit: 03/08/2023  Dx: Primary osteoarthritis of both hands   Current Dose per office note on 03/08/2023: not mentioned  Okay to refill Cymbalta?

## 2023-04-23 DIAGNOSIS — M25551 Pain in right hip: Secondary | ICD-10-CM | POA: Diagnosis not present

## 2023-04-23 DIAGNOSIS — M25511 Pain in right shoulder: Secondary | ICD-10-CM | POA: Diagnosis not present

## 2023-04-23 DIAGNOSIS — Z4789 Encounter for other orthopedic aftercare: Secondary | ICD-10-CM | POA: Diagnosis not present

## 2023-04-30 DIAGNOSIS — H02885 Meibomian gland dysfunction left lower eyelid: Secondary | ICD-10-CM | POA: Diagnosis not present

## 2023-04-30 DIAGNOSIS — H02882 Meibomian gland dysfunction right lower eyelid: Secondary | ICD-10-CM | POA: Diagnosis not present

## 2023-04-30 DIAGNOSIS — H00022 Hordeolum internum right lower eyelid: Secondary | ICD-10-CM | POA: Diagnosis not present

## 2023-05-09 DIAGNOSIS — D2271 Melanocytic nevi of right lower limb, including hip: Secondary | ICD-10-CM | POA: Diagnosis not present

## 2023-05-09 DIAGNOSIS — D2262 Melanocytic nevi of left upper limb, including shoulder: Secondary | ICD-10-CM | POA: Diagnosis not present

## 2023-05-09 DIAGNOSIS — D2222 Melanocytic nevi of left ear and external auricular canal: Secondary | ICD-10-CM | POA: Diagnosis not present

## 2023-05-09 DIAGNOSIS — L820 Inflamed seborrheic keratosis: Secondary | ICD-10-CM | POA: Diagnosis not present

## 2023-05-09 DIAGNOSIS — D485 Neoplasm of uncertain behavior of skin: Secondary | ICD-10-CM | POA: Diagnosis not present

## 2023-05-09 DIAGNOSIS — L819 Disorder of pigmentation, unspecified: Secondary | ICD-10-CM | POA: Diagnosis not present

## 2023-05-16 DIAGNOSIS — H02885 Meibomian gland dysfunction left lower eyelid: Secondary | ICD-10-CM | POA: Diagnosis not present

## 2023-05-16 DIAGNOSIS — H02882 Meibomian gland dysfunction right lower eyelid: Secondary | ICD-10-CM | POA: Diagnosis not present

## 2023-05-16 DIAGNOSIS — H16223 Keratoconjunctivitis sicca, not specified as Sjogren's, bilateral: Secondary | ICD-10-CM | POA: Diagnosis not present

## 2023-05-17 ENCOUNTER — Other Ambulatory Visit: Payer: Self-pay | Admitting: Rheumatology

## 2023-05-17 NOTE — Telephone Encounter (Signed)
Last Fill: 04/18/2023  Next Visit: 09/06/2023  Last Visit: 03/08/2023  Dx: Primary insomnia   Current Dose per office note on 03/08/2023: Ambien 5 mg 1 to 2 tablets at bedtime for insomnia.   Okay to refill Ambien?

## 2023-06-17 ENCOUNTER — Other Ambulatory Visit: Payer: Self-pay | Admitting: Rheumatology

## 2023-06-17 NOTE — Telephone Encounter (Signed)
Last Fill: 05/17/2023   Next Visit: 09/06/2023   Last Visit: 03/08/2023   Dx: Primary insomnia    Current Dose per office note on 03/08/2023: Ambien 5 mg 1 to 2 tablets at bedtime for insomnia.    Okay to refill Ambien?

## 2023-06-28 ENCOUNTER — Other Ambulatory Visit (HOSPITAL_COMMUNITY): Payer: Self-pay | Admitting: Clinical

## 2023-06-28 DIAGNOSIS — R7301 Impaired fasting glucose: Secondary | ICD-10-CM | POA: Diagnosis not present

## 2023-06-28 DIAGNOSIS — E7800 Pure hypercholesterolemia, unspecified: Secondary | ICD-10-CM

## 2023-06-28 DIAGNOSIS — E559 Vitamin D deficiency, unspecified: Secondary | ICD-10-CM | POA: Diagnosis not present

## 2023-06-28 DIAGNOSIS — Z23 Encounter for immunization: Secondary | ICD-10-CM | POA: Diagnosis not present

## 2023-06-28 DIAGNOSIS — Z0000 Encounter for general adult medical examination without abnormal findings: Secondary | ICD-10-CM | POA: Diagnosis not present

## 2023-06-28 DIAGNOSIS — G4709 Other insomnia: Secondary | ICD-10-CM | POA: Diagnosis not present

## 2023-06-28 DIAGNOSIS — M797 Fibromyalgia: Secondary | ICD-10-CM | POA: Diagnosis not present

## 2023-06-28 DIAGNOSIS — I10 Essential (primary) hypertension: Secondary | ICD-10-CM | POA: Diagnosis not present

## 2023-07-03 ENCOUNTER — Ambulatory Visit (HOSPITAL_COMMUNITY)
Admission: RE | Admit: 2023-07-03 | Discharge: 2023-07-03 | Disposition: A | Discharge status: Home / Self Care | Payer: 59 | Source: Ambulatory Visit | Attending: Clinical | Admitting: Clinical

## 2023-07-03 DIAGNOSIS — E7800 Pure hypercholesterolemia, unspecified: Secondary | ICD-10-CM | POA: Insufficient documentation

## 2023-07-18 ENCOUNTER — Other Ambulatory Visit: Payer: Self-pay | Admitting: *Deleted

## 2023-07-18 MED ORDER — ZOLPIDEM TARTRATE 5 MG PO TABS: ORAL_TABLET | ORAL | 0 refills | Status: DC

## 2023-07-18 NOTE — Telephone Encounter (Signed)
Refill request received via fax from [facility]  for Ambien  Last Fill: 06/17/2023  Next Visit: 09/04/2023  Last Visit: 03/08/2023  Dx: Primary insomnia   Current Dose per office note on 03/08/2023: Ambien 5 mg 1 to 2 tablets at bedtime for insomnia.   Okay to refill Ambien?

## 2023-07-24 ENCOUNTER — Other Ambulatory Visit: Payer: Self-pay | Admitting: Physician Assistant

## 2023-07-24 NOTE — Telephone Encounter (Signed)
Last Fill: 04/22/2023  Next Visit: 09/04/2023  Last Visit: 03/08/2023  Dx: Fibromyalgia   Current Dose per office note on 03/08/2023: not discussed  Okay to refill Cymbalta?

## 2023-08-16 ENCOUNTER — Other Ambulatory Visit: Payer: Self-pay | Admitting: Rheumatology

## 2023-08-16 NOTE — Telephone Encounter (Signed)
Last Fill: 07/18/2023  Next Visit: 09/04/2023  Last Visit: 03/08/2023  Dx:  Primary insomnia   Current Dose per office note on 03/08/2023: Ambien 5 mg 1 to 2 tablets at bedtime for insomnia.   Okay to refill Ambien?

## 2023-08-21 NOTE — Progress Notes (Unsigned)
Office Visit Note  Patient: Desiree Randall             Date of Birth: [birth year]           MRN: [id number]             PCP: Camie Patience, FNP Referring: Shon Hale, * Visit Date: 09/04/2023 Occupation: @GUAROCC @  Subjective:  Pain in both knees   History of Present Illness: Desiree Randall is a 62 y.o. female with history of osteoarthritis and fibromyalgia.  Patient reports that she was recently traveling in [place] and was more active than usual.  She states that she went on a 26 mile bike ride as well as did several hikes to be some water falls.  She states towards the end of the trip she had to use Voltaren gel on both knees for pain relief.  Since being back she has scaled back her exercise regimen.  She has been walking some for exercise but has not been doing much strength training.  She has been having ongoing discomfort in both knees especially her left knee.  She has some tenderness along the right IT band as well.  She is taking Aleve caringly for symptomatic relief.  She also applied ice to both knees which has been helpful.  She states that upon her return she also had a fibromyalgia flare.  She remains on Cymbalta 60 mg daily.  Patient was curious about possibly tapering off of Cymbalta in the future but states in the past if she has missed a dose she experiences significant withdrawal symptoms.  She continues to take Ambien at bedtime to help her sleep.  She is also taking fish oil and turmeric for the anti-inflammatory properties.  Activities of Daily Living:  Patient reports morning stiffness for 3 hours.   Patient Reports nocturnal pain.  Difficulty dressing/grooming: Denies Difficulty climbing stairs: Reports Difficulty getting out of chair: Denies Difficulty using hands for taps, buttons, cutlery, and/or writing: Reports  Review of Systems  Constitutional:  Positive for fatigue.  HENT:  Negative for mouth sores and mouth dryness.    Eyes:  Negative for dryness.  Respiratory:  Negative for shortness of breath.   Cardiovascular:  Negative for chest pain and palpitations.  Gastrointestinal:  Negative for blood in stool, constipation and diarrhea.  Endocrine: Negative for increased urination.  Genitourinary:  Negative for involuntary urination.  Musculoskeletal:  Positive for joint pain, joint pain, joint swelling, myalgias, muscle weakness, morning stiffness, muscle tenderness and myalgias. Negative for gait problem.  Skin:  Positive for hair loss. Negative for color change, rash and sensitivity to sunlight.  Allergic/Immunologic: Negative for susceptible to infections.  Neurological:  Negative for dizziness and headaches.  Hematological:  Negative for swollen glands.  Psychiatric/Behavioral:  Positive for sleep disturbance. Negative for depressed mood. The patient is not nervous/anxious.     PMFS History:  Patient Active Problem List   Diagnosis Date Noted   Osteopenia of multiple sites 02/14/2022   Primary osteoarthritis of both feet 02/14/2022   Fibromyalgia 12/27/2016   Other fatigue 12/27/2016   Primary insomnia 12/27/2016   Primary osteoarthritis of both hands 12/27/2016   Primary osteoarthritis of both knees 12/27/2016    Past Medical History:  Diagnosis Date   Basal cell carcinoma (BCC) of chest    Fibromyalgia    GERD (gastroesophageal reflux disease)    High cholesterol    Hypertension    Osteoarthritis    Osteopenia  Pneumonia     Family History  Problem Relation Age of Onset   Melanoma Mother    Heart disease Mother    Hypertension Mother    Heart attack Mother    Heart disease Father        TRIPLE BYPASS   Hypercholesterolemia Father    Kidney Stones Brother    Heart failure Maternal Grandmother    Heart attack Maternal Grandfather    Stroke Maternal Grandfather    Aneurysm Maternal Grandfather    Heart attack Paternal Grandmother    Heart disease Paternal Grandfather    Breast  cancer Neg Hx    Past Surgical History:  Procedure Laterality Date   HYSTEROTOMY  2002   KNEE SURGERY  1990-1991   OPEN SURGICAL REPAIR OF GLUTEAL TENDON Right 06/08/2022   Procedure: OPEN SURGICAL REPAIR OF GLUTEAL TENDON;  Surgeon: Yolonda Kida, MD;  Location: [facility];  Service: Orthopedics;  Laterality: Right;  90   OVARY SURGERY  2003   LEFT OVARY REMOVAL   SKIN CANCER EXCISION     toe joint replacement Bilateral 03/2018   TUBAL LIGATION  2003   LEFT FALLOPIAN TUBE REMOVED   Social History   Social History Narrative   Not on file   Immunization History  Administered Date(s) Administered   PFIZER(Purple Top)SARS-COV-2 Vaccination 02/21/2020, 03/20/2020, 10/24/2020     Objective: Vital Signs: BP (!) 166/87 (BP Location: Left Arm, Patient Position: Sitting, Cuff Size: Normal)   Pulse 71   Resp 14   Ht 5\' 2"  (1.575 m)   Wt 129 lb (58.5 kg)   BMI 23.59 kg/m    Physical Exam Vitals and nursing note reviewed.  Constitutional:      Appearance: She is well-developed.  HENT:     Head: Normocephalic and atraumatic.  Eyes:     Conjunctiva/sclera: Conjunctivae normal.  Cardiovascular:     Rate and Rhythm: Normal rate and regular rhythm.     Heart sounds: Normal heart sounds.  Pulmonary:     Effort: Pulmonary effort is normal.     Breath sounds: Normal breath sounds.  Abdominal:     General: Bowel sounds are normal.     Palpations: Abdomen is soft.  Musculoskeletal:     Cervical back: Normal range of motion.  Lymphadenopathy:     Cervical: No cervical adenopathy.  Skin:    General: Skin is warm and dry.     Capillary Refill: Capillary refill takes less than 2 seconds.  Neurological:     Mental Status: She is alert and oriented to person, place, and time.  Psychiatric:        Behavior: Behavior normal.      Musculoskeletal Exam: C-spine, thoracic spine, lumbar spine good range of motion.  Shoulder joints, elbow joints, wrist joints, MCPs, PIPs, DIPs have  good range of motion with no synovitis.  PIP and DIP thickening consistent with osteoarthritis of both hands.  Hip joints have good range of motion.  Some tenderness on the right IT band.  Discomfort with range of motion of both knee joints with some fullness in the left knee.  No knee joint effusion noted.  Ankle joints have good range of motion with no tenderness or joint swelling.  CDAI Exam: CDAI Score: -- Patient Global: --; Provider Global: -- Swollen: --; Tender: -- Joint Exam 09/04/2023   No joint exam has been documented for this visit   There is currently no information documented on the homunculus. Go to the  Rheumatology activity and complete the homunculus joint exam.  Investigation: No additional findings.  Imaging: No results found.  Recent Labs: Lab Results  Component Value Date   WBC 5.3 09/22/2020   HGB 15.0 09/22/2020   PLT 247 09/22/2020   NA 140 06/05/2022   K 4.6 06/05/2022   CL 108 06/05/2022   CO2 25 06/05/2022   GLUCOSE 103 (H) 06/05/2022   BUN 22 06/05/2022   CREATININE 0.58 06/05/2022   BILITOT 0.3 09/22/2020   AST 15 09/22/2020   ALT 12 09/22/2020   PROT 6.9 09/22/2020   CALCIUM 9.3 06/05/2022    Speciality Comments: No specialty comments available.  Procedures:  No procedures performed Allergies: Tape   Assessment / Plan:     Visit Diagnoses: Primary osteoarthritis of both hands: She has PIP and DIP thickening consistent with osteoarthritis of both hands.  No synovitis noted on examination.  Discussed the importance of joint protection and muscle strengthening.  Primary osteoarthritis of both knees: Patient is been experiencing increased pain in both knees for the past 1 month.  She was recently traveling in [place] and was more active than usual.  She biked 26 miles as well as did several hikes to the [place] which she feels exacerbated her discomfort.  While in [place] she purchased Voltaren gel which she used topically for pain  relief.  She has also been taking Aleve sparingly for pain relief.  She has tried applying ice to her knees especially after physical activity.  Her knee joint pain is gradually improving. On examination today she has good range of motion of both knee joints with some discomfort bilaterally.  Some fullness in the left knee was noted but no knee joint effusion was apparent.  Discussed that if her symptoms persist or worsen she can return for a left knee joint cortisone injection.  Also discussed the option of viscosupplementation in the future if needed.  Primary osteoarthritis of both feet - Dr. Al Corpus, bilateral 1st MTP joint replacements May 2019.  Tear of right gluteus medius tendon, subsequent encounter: gluteus medius and gluteus minimus rupture in the spring 2022. Surgically repaired by Dr. Aundria Rud on 06/08/2022.   Trochanteric bursitis, right hip: She has some tenderness along the right IT band.  Fibromyalgia: Patient experiences occasional flares of fibromyalgia at which time she has generalized hyperalgesia.  Upon returning from her recent trip to [place] she had a fibromyalgia flare.  She has been taking Cymbalta 60 mg 1 capsule daily and takes Ambien 5 mg 1 to 2 tablets at bedtime for insomnia.  She tries to remain active exercising on a regular basis. Patient brought up possibly wanting to taper off of Cymbalta in the future.  Discussed that she would require a slow titration of the dose of Cymbalta especially since she is experienced withdrawal symptoms in the past.  Overall her fibromyalgia has been well-controlled on Cymbalta so no medication changes will be made at this time.  She will notify us when and if she would like to start titrating the dose of Cymbalta in the future.   Primary insomnia - She takes Ambien 5 mg 1-2 tablets at bedtime for insomnia.    Other fatigue: Stable. She remains active exercising on a regular basis.   Other chronic pain: Managed with the use of Cymbalta  60 mg 1 capsule daily.  She takes aleve sparingly for pain relief.   Other medical conditions are listed as follows:   Osteopenia of multiple sites - -  DEXA 09/28/2020 T-score: -1.4.  Ordered by gynecologist.  Due to update DEXA in November 2023. No recent DEXA in Epic to review. She continues to take a calcium and vitamin D daily.  Trigger little finger of left hand: Resolved.   Orders: No orders of the defined types were placed in this encounter.  No orders of the defined types were placed in this encounter.   Follow-Up Instructions: Return in about 6 months (around 03/04/2024) for Osteoarthritis, Fibromyalgia.   Gearldine Bienenstock, PA-C  Note - This record has been created using Dragon software.  Chart creation errors have been sought, but may not always  have been located. Such creation errors do not reflect on  the standard of medical care.

## 2023-09-04 ENCOUNTER — Ambulatory Visit: Payer: BC Managed Care – PPO | Attending: Physician Assistant | Admitting: Physician Assistant

## 2023-09-04 ENCOUNTER — Encounter: Payer: Self-pay | Admitting: Physician Assistant

## 2023-09-04 VITALS — BP 166/87 | HR 71 | Resp 14 | Ht 62.0 in | Wt 129.0 lb

## 2023-09-04 DIAGNOSIS — M19072 Primary osteoarthritis, left ankle and foot: Secondary | ICD-10-CM

## 2023-09-04 DIAGNOSIS — S76011D Strain of muscle, fascia and tendon of right hip, subsequent encounter: Secondary | ICD-10-CM

## 2023-09-04 DIAGNOSIS — M19041 Primary osteoarthritis, right hand: Secondary | ICD-10-CM | POA: Diagnosis not present

## 2023-09-04 DIAGNOSIS — M797 Fibromyalgia: Secondary | ICD-10-CM

## 2023-09-04 DIAGNOSIS — M170 Bilateral primary osteoarthritis of knee: Secondary | ICD-10-CM

## 2023-09-04 DIAGNOSIS — M19042 Primary osteoarthritis, left hand: Secondary | ICD-10-CM

## 2023-09-04 DIAGNOSIS — R5383 Other fatigue: Secondary | ICD-10-CM

## 2023-09-04 DIAGNOSIS — G8929 Other chronic pain: Secondary | ICD-10-CM

## 2023-09-04 DIAGNOSIS — M7061 Trochanteric bursitis, right hip: Secondary | ICD-10-CM

## 2023-09-04 DIAGNOSIS — M65352 Trigger finger, left little finger: Secondary | ICD-10-CM

## 2023-09-04 DIAGNOSIS — M19071 Primary osteoarthritis, right ankle and foot: Secondary | ICD-10-CM

## 2023-09-04 DIAGNOSIS — F5101 Primary insomnia: Secondary | ICD-10-CM

## 2023-09-04 DIAGNOSIS — M8589 Other specified disorders of bone density and structure, multiple sites: Secondary | ICD-10-CM

## 2023-09-06 ENCOUNTER — Ambulatory Visit: Payer: 59 | Admitting: Physician Assistant

## 2023-09-13 ENCOUNTER — Other Ambulatory Visit: Payer: Self-pay | Admitting: Rheumatology

## 2023-09-13 NOTE — Telephone Encounter (Signed)
Last Fill: 08/16/2023  Next Visit: 03/04/2024  Last Visit: 09/04/2023  Dx: Primary insomnia   Current Dose per office note on 09/04/2023: Ambien 5 mg 1-2 tablets at bedtime for insomnia.    Okay to refill Ambien?

## 2023-10-14 ENCOUNTER — Other Ambulatory Visit: Payer: Self-pay | Admitting: *Deleted

## 2023-10-14 MED ORDER — ZOLPIDEM TARTRATE 5 MG PO TABS: ORAL_TABLET | ORAL | 0 refills | Status: DC

## 2023-10-14 NOTE — Telephone Encounter (Signed)
Refill request received via fax from [facility] [address]  for Ambien   Last Fill: 09/13/2023   Next Visit: 03/04/2024   Last Visit: 09/04/2023   Dx: Primary insomnia    Current Dose per office note on 09/04/2023: Ambien 5 mg 1-2 tablets at bedtime for insomnia.     Okay to refill Ambien?

## 2023-10-18 ENCOUNTER — Other Ambulatory Visit: Payer: Self-pay | Admitting: Physician Assistant

## 2023-10-21 NOTE — Telephone Encounter (Signed)
Last Fill: 07/24/2023  Next Visit: 03/04/2024  Last Visit: 09/04/2023  Dx: Fibromyalgia   Current Dose per office note on 09/04/2023: Cymbalta 60 mg 1 capsule daily   Okay to refill Cymbalta?

## 2023-10-25 DIAGNOSIS — Z1211 Encounter for screening for malignant neoplasm of colon: Secondary | ICD-10-CM | POA: Diagnosis not present

## 2023-11-15 ENCOUNTER — Other Ambulatory Visit: Payer: Self-pay | Admitting: Physician Assistant

## 2023-11-15 NOTE — Telephone Encounter (Signed)
Last Fill: 10/14/2023   Next Visit: 03/04/2024   Last Visit: 09/04/2023   Dx: Primary insomnia    Current Dose per office note on 09/04/2023: Ambien 5 mg 1-2 tablets at bedtime for insomnia.     Okay to refill Ambien?

## 2023-11-25 DIAGNOSIS — H02882 Meibomian gland dysfunction right lower eyelid: Secondary | ICD-10-CM | POA: Diagnosis not present

## 2023-11-25 DIAGNOSIS — H16223 Keratoconjunctivitis sicca, not specified as Sjogren's, bilateral: Secondary | ICD-10-CM | POA: Diagnosis not present

## 2023-11-25 DIAGNOSIS — H02885 Meibomian gland dysfunction left lower eyelid: Secondary | ICD-10-CM | POA: Diagnosis not present

## 2023-12-16 ENCOUNTER — Other Ambulatory Visit: Payer: Self-pay | Admitting: *Deleted

## 2023-12-16 MED ORDER — ZOLPIDEM TARTRATE 5 MG PO TABS: ORAL_TABLET | ORAL | 0 refills | Status: DC

## 2023-12-16 NOTE — Telephone Encounter (Signed)
[facility]  Last Fill: 11/15/2023   Next Visit: 03/04/2024   Last Visit: 09/04/2023   Dx: Primary insomnia    Current Dose per office note on 09/04/2023: Ambien 5 mg 1-2 tablets at bedtime for insomnia.     Okay to refill Ambien?

## 2024-01-16 ENCOUNTER — Other Ambulatory Visit: Payer: Self-pay | Admitting: Physician Assistant

## 2024-01-16 NOTE — Telephone Encounter (Signed)
 Last Fill: 12/16/2023   Next Visit: 03/04/2024   Last Visit: 09/04/2023   Dx: Primary insomnia    Current Dose per office note on 09/04/2023: Ambien 5 mg 1-2 tablets at bedtime for insomnia.     Okay to refill Ambien?

## 2024-01-23 ENCOUNTER — Other Ambulatory Visit: Payer: Self-pay | Admitting: Physician Assistant

## 2024-01-23 NOTE — Telephone Encounter (Signed)
 Last Fill: 10/21/2023   Next Visit: 03/04/2024   Last Visit: 09/04/2023   Dx: Fibromyalgia    Current Dose per office note on 09/04/2023: Cymbalta 60 mg 1 capsule daily    Okay to refill Cymbalta?

## 2024-02-13 ENCOUNTER — Other Ambulatory Visit: Payer: Self-pay | Admitting: *Deleted

## 2024-02-13 MED ORDER — ZOLPIDEM TARTRATE 5 MG PO TABS: ORAL_TABLET | ORAL | 0 refills | Status: DC

## 2024-02-13 NOTE — Telephone Encounter (Signed)
 Refill request received via fax from [facility]  for Ambien  Last Fill: 01/16/2024   Next Visit: 03/04/2024   Last Visit: 09/04/2023   Dx: Primary insomnia    Current Dose per office note on 09/04/2023: Ambien 5 mg 1-2 tablets at bedtime for insomnia.     Okay to refill Ambien?

## 2024-02-19 NOTE — Progress Notes (Signed)
 Office Visit Note  Patient: Desiree Randall             Date of Birth: 1961/04/20           MRN: [id number]             PCP: Bufford Carne, FNP Referring: Bufford Carne, FNP Visit Date: 03/04/2024 Occupation: @GUAROCC @  Subjective:  Generalized pain and discomfort.  History of Present Illness: Desiree Randall is a 63 y.o. female with osteoarthritis and fibromyalgia syndrome.  She returns today after her last visit in October 2024.  She continues to have some stiffness in her hands, knee joints and her feet.  She has not noticed any joint swelling.  She has some discomfort over the right gluteal region where she had scar tissue.  She has been having flares of fibromyalgia with increased stress in the family.  She continues to take Cymbalta 60 mg and Ambien 5 to 10 mg at bedtime for insomnia.  She continues to have fatigue and tender points.    Activities of Daily Living:  Patient reports morning stiffness for 2 hours.   Patient Denies nocturnal pain.  Difficulty dressing/grooming: Denies Difficulty climbing stairs: Reports Difficulty getting out of chair: Denies Difficulty using hands for taps, buttons, cutlery, and/or writing: Reports  Review of Systems  Constitutional: Negative.  Negative for fatigue.  HENT: Negative.  Negative for mouth sores and mouth dryness.   Eyes:  Positive for dryness.  Respiratory:  Positive for shortness of breath.   Cardiovascular:  Positive for palpitations. Negative for chest pain.  Gastrointestinal: Negative.  Negative for blood in stool, constipation and diarrhea.  Endocrine: Negative.  Negative for increased urination.  Genitourinary: Negative.  Negative for involuntary urination.  Musculoskeletal:  Positive for joint pain, joint pain, joint swelling, muscle weakness, morning stiffness and muscle tenderness. Negative for gait problem, myalgias and myalgias.  Skin: Negative.  Negative for color change, rash, hair loss and  sensitivity to sunlight.  Allergic/Immunologic: Negative.  Negative for susceptible to infections.  Neurological:  Positive for headaches. Negative for dizziness.  Hematological: Negative.  Negative for swollen glands.  Psychiatric/Behavioral: Negative.  Negative for depressed mood and sleep disturbance. The patient is not nervous/anxious.     PMFS History:  Patient Active Problem List   Diagnosis Date Noted   Allergic rhinitis 03/04/2024   Atopic dermatitis 03/04/2024   Chronic allergic conjunctivitis 03/04/2024   Encounter for other orthopedic aftercare 04/12/2022   Osteopenia of multiple sites 02/14/2022   Primary osteoarthritis of both feet 02/14/2022   Fibromyalgia 12/27/2016   Other fatigue 12/27/2016   Primary insomnia 12/27/2016   Primary osteoarthritis of both hands 12/27/2016   Primary osteoarthritis of both knees 12/27/2016    Past Medical History:  Diagnosis Date   Basal cell carcinoma (BCC) of chest    Fibromyalgia    GERD (gastroesophageal reflux disease)    High cholesterol    Hypertension    Osteoarthritis    Osteopenia    Pneumonia     Family History  Problem Relation Age of Onset   Melanoma Mother    Heart disease Mother    Hypertension Mother    Heart attack Mother    Heart disease Father        TRIPLE BYPASS   Hypercholesterolemia Father    Kidney Stones Brother    Heart failure Maternal Grandmother    Heart attack Maternal Grandfather    Stroke Maternal Grandfather    Aneurysm Maternal  Grandfather    Heart attack Paternal Grandmother    Heart disease Paternal Grandfather    Breast cancer Neg Hx    Past Surgical History:  Procedure Laterality Date   HYSTEROTOMY  2002   KNEE SURGERY  1990-1991   OPEN SURGICAL REPAIR OF GLUTEAL TENDON Right 06/08/2022   Procedure: OPEN SURGICAL REPAIR OF GLUTEAL TENDON;  Surgeon: Janeth Medicus, MD;  Location: [facility];  Service: Orthopedics;  Laterality: Right;  90   OVARY SURGERY  2003   LEFT OVARY  REMOVAL   SKIN CANCER EXCISION     toe joint replacement Bilateral 03/2018   TUBAL LIGATION  2003   LEFT FALLOPIAN TUBE REMOVED   Social History   Social History Narrative   Not on file   Immunization History  Administered Date(s) Administered   PFIZER(Purple Top)SARS-COV-2 Vaccination 02/21/2020, 03/20/2020, 10/24/2020   Tdap 06/28/2023     Objective: Vital Signs: BP (!) 135/91 (BP Location: Right Arm, Patient Position: Sitting, Cuff Size: Normal)   Pulse 76   Resp 16   Ht 5\' 2"  (1.575 m)   Wt 131 lb (59.4 kg)   BMI 23.96 kg/m    Physical Exam Vitals and nursing note reviewed.  Constitutional:      Appearance: She is well-developed.  HENT:     Head: Normocephalic and atraumatic.  Eyes:     Conjunctiva/sclera: Conjunctivae normal.  Cardiovascular:     Rate and Rhythm: Normal rate and regular rhythm.     Heart sounds: Normal heart sounds.  Pulmonary:     Effort: Pulmonary effort is normal.     Breath sounds: Normal breath sounds.  Abdominal:     General: Bowel sounds are normal.     Palpations: Abdomen is soft.  Musculoskeletal:     Cervical back: Normal range of motion.  Lymphadenopathy:     Cervical: No cervical adenopathy.  Skin:    General: Skin is warm and dry.     Capillary Refill: Capillary refill takes less than 2 seconds.  Neurological:     Mental Status: She is alert and oriented to person, place, and time.  Psychiatric:        Behavior: Behavior normal.      Musculoskeletal Exam: Cervical, thoracic and lumbar spine with good range of motion.  Shoulders, elbows, wrist joints, MCPs PIPs and DIPs Juengel range of motion.  She had bilateral CMC PIP and DIP thickening.  Hip joints were in good range of motion.  She had tenderness over right trochanteric bursa.  Knee joints and ankle joints in good range of motion.  There was no tenderness over MTPs.  She had generalized hyperalgesia and positive tender points.  CDAI Exam: CDAI Score: -- Patient  Global: --; Provider Global: -- Swollen: --; Tender: -- Joint Exam 03/04/2024   No joint exam has been documented for this visit   There is currently no information documented on the homunculus. Go to the Rheumatology activity and complete the homunculus joint exam.  Investigation: No additional findings.  Imaging: No results found.  Recent Labs: Lab Results  Component Value Date   WBC 5.3 09/22/2020   HGB 15.0 09/22/2020   PLT 247 09/22/2020   NA 140 06/05/2022   K 4.6 06/05/2022   CL 108 06/05/2022   CO2 25 06/05/2022   GLUCOSE 103 (H) 06/05/2022   BUN 22 06/05/2022   CREATININE 0.58 06/05/2022   BILITOT 0.3 09/22/2020   AST 15 09/22/2020   ALT 12 09/22/2020  PROT 6.9 09/22/2020   CALCIUM 9.3 06/05/2022    Speciality Comments: No specialty comments available.  Procedures:  No procedures performed Allergies: Tape   Assessment / Plan:     Visit Diagnoses: Primary osteoarthritis of both hands-she continues to have some stiffness in her hands.  Bilateral CMC PIP and DIP thickening was noted.  Joint protection muscle strengthening was discussed.  Trigger little finger of left hand-resolved.  Primary osteoarthritis of both knees -she has intermittent discomfort in her knee joints.  No warmth swelling or effusion was noted.  Uses topical Voltaren gel and oral Aleve as needed  Primary osteoarthritis of both feet - Bilateral first MTP joint replacement May 2019 by Dr. Lara Plants.  She has intermittent discomfort.  No tenderness was noted on the examination today.  Tear of right gluteus medius tendon, subsequent encounter - Spring 2022.  Status postrepair July 2023 by Dr. Hiram Lukes.  She had good recovery.  She still has some discomfort in the scar tissue.  Trochanteric bursitis, right hip-she had tenderness over right trochanteric bursa.  IT band stretches were discussed.  Osteopenia of multiple sites - DEXA 09/28/2020 T-score: -1.4.  Ordered by gynecologist.  Patient has not  had repeat DEXA scan.  She will discuss with her GYN.  Fibromyalgia -she continues to have generalized pain and discomfort from fibromyalgia.  She also has positive tender points.  She continues to be on Cymbalta 60 mg p.o. daily, Ambien 5 mg 1 to 2 tablets p.o. nightly as needed.  Patient states she has been going through a stressful time.  She lost her mother-in-law few days back and also her mother is in hospice.  Need for regular exercise and stretching was discussed.  Other fatigue-related to fibromyalgia and insomnia.  Primary insomnia - On Ambien 5 to 10 mg p.o. nightly as needed.  Patient states she has been needing Ambien 10 mg at nighttime recently due to stress.  Other chronic pain - On Cymbalta 60 mg p.o. daily.  Patient gets labs through her PCP.  I advised patient to forward labs to us .  Orders: No orders of the defined types were placed in this encounter.  No orders of the defined types were placed in this encounter.    Follow-Up Instructions: Return in about 6 months (around 09/03/2024) for Osteoarthritis.   Nicholas Bari, MD  Note - This record has been created using Animal nutritionist.  Chart creation errors have been sought, but may not always  have been located. Such creation errors do not reflect on  the standard of medical care.

## 2024-03-04 ENCOUNTER — Encounter: Payer: Self-pay | Admitting: Rheumatology

## 2024-03-04 ENCOUNTER — Ambulatory Visit: Payer: 59 | Attending: Rheumatology | Admitting: Rheumatology

## 2024-03-04 VITALS — BP 135/91 | HR 76 | Resp 16 | Ht 62.0 in | Wt 131.0 lb

## 2024-03-04 DIAGNOSIS — S76011D Strain of muscle, fascia and tendon of right hip, subsequent encounter: Secondary | ICD-10-CM | POA: Diagnosis not present

## 2024-03-04 DIAGNOSIS — M8589 Other specified disorders of bone density and structure, multiple sites: Secondary | ICD-10-CM

## 2024-03-04 DIAGNOSIS — M19042 Primary osteoarthritis, left hand: Secondary | ICD-10-CM

## 2024-03-04 DIAGNOSIS — M65352 Trigger finger, left little finger: Secondary | ICD-10-CM

## 2024-03-04 DIAGNOSIS — M7061 Trochanteric bursitis, right hip: Secondary | ICD-10-CM

## 2024-03-04 DIAGNOSIS — J309 Allergic rhinitis, unspecified: Secondary | ICD-10-CM | POA: Insufficient documentation

## 2024-03-04 DIAGNOSIS — M19071 Primary osteoarthritis, right ankle and foot: Secondary | ICD-10-CM | POA: Diagnosis not present

## 2024-03-04 DIAGNOSIS — F5101 Primary insomnia: Secondary | ICD-10-CM

## 2024-03-04 DIAGNOSIS — M19041 Primary osteoarthritis, right hand: Secondary | ICD-10-CM

## 2024-03-04 DIAGNOSIS — M797 Fibromyalgia: Secondary | ICD-10-CM

## 2024-03-04 DIAGNOSIS — M19072 Primary osteoarthritis, left ankle and foot: Secondary | ICD-10-CM

## 2024-03-04 DIAGNOSIS — L209 Atopic dermatitis, unspecified: Secondary | ICD-10-CM | POA: Insufficient documentation

## 2024-03-04 DIAGNOSIS — R5383 Other fatigue: Secondary | ICD-10-CM

## 2024-03-04 DIAGNOSIS — M170 Bilateral primary osteoarthritis of knee: Secondary | ICD-10-CM | POA: Diagnosis not present

## 2024-03-04 DIAGNOSIS — H1045 Other chronic allergic conjunctivitis: Secondary | ICD-10-CM | POA: Insufficient documentation

## 2024-03-04 DIAGNOSIS — G8929 Other chronic pain: Secondary | ICD-10-CM

## 2024-03-16 ENCOUNTER — Other Ambulatory Visit: Payer: Self-pay | Admitting: Rheumatology

## 2024-03-16 NOTE — Telephone Encounter (Signed)
 Last Fill: 02/13/2024  Next Visit: 09/02/2024  Last Visit: 03/04/2024  Dx: Primary insomnia   Current Dose per office note on 03/04/2024: Ambien  5 to 10 mg p.o. nightly as needed.    Okay to refill Ambien ?

## 2024-03-18 ENCOUNTER — Telehealth: Payer: Self-pay | Admitting: *Deleted

## 2024-03-18 NOTE — Telephone Encounter (Signed)
 Submitted a Prior Authorization request to [facility] for Ambien  via CoverMyMeds. Will update once we receive a response.

## 2024-04-14 ENCOUNTER — Other Ambulatory Visit: Payer: Self-pay | Admitting: Rheumatology

## 2024-04-14 ENCOUNTER — Other Ambulatory Visit: Payer: Self-pay | Admitting: Physician Assistant

## 2024-04-14 NOTE — Telephone Encounter (Signed)
 Last Fill: 03/16/2024  Next Visit: 09/02/2024  Last Visit: 03/04/2024  Dx: Fibromyalgia   Current Dose per office note on 03/04/2024:  Ambien  5 mg 1 to 2 tablets p.o. nightly as needed.   Okay to refill Ambien ?

## 2024-04-14 NOTE — Telephone Encounter (Signed)
 Last Fill: 01/23/2024  Next Visit: 09/02/2024  Last Visit: 03/04/2024  Dx: Fibromyalgia   Current Dose per office note on 03/04/2024: Cymbalta  60 mg p.o. daily   Okay to refill Cymbalta ?

## 2024-05-15 ENCOUNTER — Other Ambulatory Visit: Payer: Self-pay | Admitting: Rheumatology

## 2024-05-15 NOTE — Telephone Encounter (Signed)
 Last Fill: 04/14/2024   Next Visit: 09/02/2024   Last Visit: 03/04/2024   Dx: Fibromyalgia    Current Dose per office note on 03/04/2024:  Ambien  5 mg 1 to 2 tablets p.o. nightly as needed.    Okay to refill Ambien ?

## 2024-06-15 ENCOUNTER — Other Ambulatory Visit: Payer: Self-pay

## 2024-06-15 MED ORDER — ZOLPIDEM TARTRATE 5 MG PO TABS: ORAL_TABLET | ORAL | 0 refills | Status: DC

## 2024-06-15 NOTE — Telephone Encounter (Signed)
 Last Fill: 05/17/2024  Next Visit: 09/02/2024  Last Visit: 03/04/2024  Dx: Primary insomnia   Current Dose per office note on 03/04/2024: Ambien  5 to 10 mg p.o. nightly as needed   Okay to refill Ambien ?

## 2024-07-07 ENCOUNTER — Other Ambulatory Visit: Payer: Self-pay | Admitting: Physician Assistant

## 2024-07-07 DIAGNOSIS — M797 Fibromyalgia: Secondary | ICD-10-CM | POA: Diagnosis not present

## 2024-07-07 DIAGNOSIS — I700 Atherosclerosis of aorta: Secondary | ICD-10-CM | POA: Diagnosis not present

## 2024-07-07 DIAGNOSIS — I10 Essential (primary) hypertension: Secondary | ICD-10-CM | POA: Diagnosis not present

## 2024-07-07 DIAGNOSIS — Z23 Encounter for immunization: Secondary | ICD-10-CM | POA: Diagnosis not present

## 2024-07-07 DIAGNOSIS — E7800 Pure hypercholesterolemia, unspecified: Secondary | ICD-10-CM | POA: Diagnosis not present

## 2024-07-07 DIAGNOSIS — Z0000 Encounter for general adult medical examination without abnormal findings: Secondary | ICD-10-CM | POA: Diagnosis not present

## 2024-07-07 LAB — LAB REPORT - SCANNED: EGFR: 98

## 2024-07-07 NOTE — Telephone Encounter (Signed)
 Last Fill: 04/14/2024  Next Visit: 09/02/2024  Last Visit: 03/04/2024  Dx: Fibromyalgia   Current Dose per office note on 03/04/2024: Cymbalta  60 mg p.o. daily   Okay to refill Cymbalta ?

## 2024-07-15 ENCOUNTER — Other Ambulatory Visit: Payer: Self-pay | Admitting: Physician Assistant

## 2024-07-16 NOTE — Telephone Encounter (Signed)
 Last Fill: 06/15/2024  Next Visit: 09/02/2024  Last Visit: 03/04/2024  DX: Primary insomnia   Current Dose per office note on 03/04/2024: On Ambien  5 to 10 mg p.o. nightly as needed.   Okay to refill ambien ?

## 2024-08-03 DIAGNOSIS — Z0000 Encounter for general adult medical examination without abnormal findings: Secondary | ICD-10-CM | POA: Diagnosis not present

## 2024-08-03 DIAGNOSIS — D72819 Decreased white blood cell count, unspecified: Secondary | ICD-10-CM | POA: Diagnosis not present

## 2024-08-14 ENCOUNTER — Other Ambulatory Visit: Payer: Self-pay

## 2024-08-14 MED ORDER — ZOLPIDEM TARTRATE 5 MG PO TABS: ORAL_TABLET | ORAL | 0 refills | Status: DC

## 2024-08-14 NOTE — Telephone Encounter (Signed)
 Refill request for ambien  received via fax from [facility] [address].   Last Fill: 07/16/2024  Next Visit: 09/02/2024  Last Visit: 03/04/2024  DX: Primary insomnia   Current Dose per office note on 03/04/2024: On Ambien  5 to 10 mg p.o. nightly as needed.   Okay to refill ambien ?

## 2024-08-19 NOTE — Progress Notes (Unsigned)
 Office Visit Note  Patient: Desiree Randall             Date of Birth: [birth year]           MRN: [id number]             PCP: Dyane Anthony RAMAN, FNP Referring: Dyane Anthony RAMAN, FNP Visit Date: 09/02/2024 Occupation: Data Unavailable  Subjective:  Medication monitoring   History of Present Illness: Desiree Randall is a 63 y.o. female with history of osteoarthritis and osteopenia.  Patient mains on Cymbalta  60 mg 1 capsule daily.  She presents today experiencing increased myofascial pain and arthralgias which she attributes to an increase in her activity level.  She is having increased discomfort in both elbows and both knee joints.  Patient attributes the elevation of her blood pressure due to the pain level she is experiencing.  She continues to use Voltaren  gel topically as needed for pain relief.  She remains on Ambien  5 mg at bedtime for insomnia. Patient states that she is upcoming appointment with hematology to establish care due to low white blood cell count. Patient states that she will also be establishing care with cardiology due to strong family history of cardiovascular disease.  Patient has a history of elevated cholesterol as well as an elevated blood pressure.    Activities of Daily Living:  Patient reports morning stiffness for 2 hours.   Patient Reports nocturnal pain.  Difficulty dressing/grooming: Denies Difficulty climbing stairs: Reports Difficulty getting out of chair: Denies Difficulty using hands for taps, buttons, cutlery, and/or writing: Reports  Review of Systems  Constitutional:  Positive for fatigue.  HENT:  Negative for mouth sores and mouth dryness.   Eyes:  Positive for dryness.  Respiratory:  Positive for shortness of breath.   Cardiovascular:  Negative for chest pain and palpitations.  Gastrointestinal:  Negative for blood in stool, constipation and diarrhea.  Endocrine: Negative for increased urination.  Genitourinary:  Negative  for involuntary urination.  Musculoskeletal:  Positive for joint pain, joint pain, joint swelling, myalgias, muscle weakness, morning stiffness, muscle tenderness and myalgias. Negative for gait problem.  Skin:  Negative for color change, rash, hair loss and sensitivity to sunlight.  Allergic/Immunologic: Negative for susceptible to infections.  Neurological:  Positive for headaches. Negative for dizziness.  Hematological:  Negative for swollen glands.  Psychiatric/Behavioral:  Positive for sleep disturbance. Negative for depressed mood. The patient is not nervous/anxious.     PMFS History:  Patient Active Problem List   Diagnosis Date Noted   Allergic rhinitis 03/04/2024   Atopic dermatitis 03/04/2024   Chronic allergic conjunctivitis 03/04/2024   Encounter for other orthopedic aftercare 04/12/2022   Osteopenia of multiple sites 02/14/2022   Primary osteoarthritis of both feet 02/14/2022   Fibromyalgia 12/27/2016   Other fatigue 12/27/2016   Primary insomnia 12/27/2016   Primary osteoarthritis of both hands 12/27/2016   Primary osteoarthritis of both knees 12/27/2016    Past Medical History:  Diagnosis Date   Aortic atherosclerosis    Basal cell carcinoma (BCC) of chest    Fibromyalgia    GERD (gastroesophageal reflux disease)    High cholesterol    Hypercholesterolemia    Hypertension    Osteoarthritis    Osteopenia    Other insomnia    Pneumonia    Seasonal allergies     Family History  Problem Relation Age of Onset   Melanoma Mother    Heart disease Mother    Hypertension Mother  Heart attack Mother    Heart disease Father        TRIPLE BYPASS   Hypercholesterolemia Father    Kidney Stones Brother    Heart failure Maternal Grandmother    Heart attack Maternal Grandfather    Stroke Maternal Grandfather    Aneurysm Maternal Grandfather    Heart attack Paternal Grandmother    Heart disease Paternal Grandfather    Breast cancer Neg Hx    Past Surgical  History:  Procedure Laterality Date   HYSTEROTOMY  2002   KNEE SURGERY  1990-1991   OPEN SURGICAL REPAIR OF GLUTEAL TENDON Right 06/08/2022   Procedure: OPEN SURGICAL REPAIR OF GLUTEAL TENDON;  Surgeon: Sharl Selinda Dover, MD;  Location: [facility];  Service: Orthopedics;  Laterality: Right;  90   OVARY SURGERY  2003   LEFT OVARY REMOVAL   SKIN CANCER EXCISION     toe joint replacement Bilateral 03/2018   TUBAL LIGATION  2003   LEFT FALLOPIAN TUBE REMOVED   Social History   Tobacco Use   Smoking status: Never    Passive exposure: Never   Smokeless tobacco: Never  Vaping Use   Vaping status: Never Used  Substance Use Topics   Alcohol use: Yes    Alcohol/week: 0.0 standard drinks of alcohol    Comment: rarely   Drug use: No   Social History   Social History Narrative   Not on file     Immunization History  Administered Date(s) Administered   PFIZER(Purple Top)SARS-COV-2 Vaccination 02/21/2020, 03/20/2020, 10/24/2020   Tdap 06/28/2023     Objective: Vital Signs: BP (!) 143/86   Pulse 74   Temp 97.8 F (36.6 C)   Resp 12   Ht 5' 2.5 (1.588 m)   Wt 131 lb 9.6 oz (59.7 kg)   BMI 23.69 kg/m    Physical Exam Vitals and nursing note reviewed.  Constitutional:      Appearance: She is well-developed.  HENT:     Head: Normocephalic and atraumatic.  Eyes:     Conjunctiva/sclera: Conjunctivae normal.  Cardiovascular:     Rate and Rhythm: Normal rate and regular rhythm.     Heart sounds: Normal heart sounds.  Pulmonary:     Effort: Pulmonary effort is normal.     Breath sounds: Normal breath sounds.  Abdominal:     General: Bowel sounds are normal.     Palpations: Abdomen is soft.  Musculoskeletal:     Cervical back: Normal range of motion.  Skin:    General: Skin is warm and dry.     Capillary Refill: Capillary refill takes less than 2 seconds.     Comments: Xerosis noted on extensor surface of both elbows.  No plaques noted.  No fingernail pitting noted.  No  psoriasis patches along the hairline at this time.   Neurological:     Mental Status: She is alert and oriented to person, place, and time.  Psychiatric:        Behavior: Behavior normal.      Musculoskeletal Exam: C-spine, thoracic spine, lumbar spine have good range of motion.  No midline spinal tenderness.  No SI joint tenderness.  Shoulder joints, elbow joints, wrist joints, MCPs, PIPs, DIPs have good range of motion with no synovitis.  CMC, PIP, DIP thickening consistent with osteoarthritis of both hands.  Complete fist formation bilaterally.  Hip joints have good range of motion with no groin pain.  Knee joints have good range of motion no warmth or  effusion.  Ankle joints have good range of motion no tenderness or joint swelling.  No evidence of Achilles tendinitis or plantar fasciitis.   CDAI Exam: CDAI Score: -- Patient Global: --; Provider Global: -- Swollen: --; Tender: -- Joint Exam 09/02/2024   No joint exam has been documented for this visit   There is currently no information documented on the homunculus. Go to the Rheumatology activity and complete the homunculus joint exam.  Investigation: No additional findings.  Imaging: No results found.  Recent Labs: Lab Results  Component Value Date   WBC 5.3 09/22/2020   HGB 15.0 09/22/2020   PLT 247 09/22/2020   NA 140 06/05/2022   K 4.6 06/05/2022   CL 108 06/05/2022   CO2 25 06/05/2022   GLUCOSE 103 (H) 06/05/2022   BUN 22 06/05/2022   CREATININE 0.58 06/05/2022   BILITOT 0.3 09/22/2020   AST 15 09/22/2020   ALT 12 09/22/2020   PROT 6.9 09/22/2020   CALCIUM 9.3 06/05/2022    Speciality Comments: No specialty comments available.  Procedures:  No procedures performed Allergies: Tape   Assessment / Plan:     Visit Diagnoses: Primary osteoarthritis of both hands: Family history of RA: She has PIP and DIP thickening consistent with osteoarthritis.  No synovitis or dactylitis noted.  Complete fist formation  bilaterally.  Discussed the importance of joint protection and muscle strengthening.  No signs of inflammatory arthritis noted at this time.  Could consider an ultrasound in the future if needed.  Primary osteoarthritis of both knees: Patient presents today with increased discomfort in both knee joints.  Patient notices increased arthralgias and joint stiffness typically exacerbated by her activity level.  On examination she has good range of motion of both knee joints with no warmth or effusion noted.  She is Voltaren  topically as needed for symptomatic relief.  Primary osteoarthritis of both feet - Bilateral first MTP joint replacement May 2019 by Dr. Verta.  She has good range of motion of both ankle joints with no tenderness or joint swelling.  She is wearing proper fitting shoes.  Tear of right gluteus medius tendon, subsequent encounter: Spring 2022.  Status postrepair July 2023 by Dr. Sharl.   Trochanteric bursitis, right hip: Intermittent discomfort.    Osteopenia of multiple sites - DEXA 09/28/2020 T-score: -1.4.  Ordered by gynecologist.  Patient has not had repeat DEXA scan.  She will discuss with her GYN.  Fibromyalgia -She experiences intermittent myalgias or muscle tenderness due to fibromyalgia.  She remains on Cymbalta  60 mg 1 capsule daily.  She is currently experiencing a flare which was exacerbated by more strenuous activity. She has been taking Ambien  5 to 10 mg at bedtime for insomnia.  Discussed the importance of regular exercise and good sleep hygiene.  Other fatigue: Chronic, related to insomnia.   Primary insomnia- She takes Ambien  5 to 10 mg at bedtime as needed.  Discussed the importance of good sleep hygiene.  Other chronic pain: Intermittent arthralgias and myalgias.   Dry skin: Xerosis noted on the extensor surface of both elbows.  No obvious plaques or patches of psoriasis.  No fingernail pitting noted.  Chronic leukopenia: White blood cell count was 3.5 on  07/07/2024.  She has had a low white blood cell count over the past 1 year.  She is scheduled to establish care with hematology on 09/09/2024 for further evaluation.   History of hyperlipidemia: History of elevated total cholesterol and LDL.  Recent lipid panel obtained on  07/07/2024 revealed total cholesterol of 191, triglycerides 66, HDL 78, LDL 100.  She has been taking Crestor 5 mg 1 tablet daily.  She will be establishing care with cardiology on 09/17/2024.  Orders: No orders of the defined types were placed in this encounter.  No orders of the defined types were placed in this encounter.    Follow-Up Instructions: Return in about 6 months (around 03/03/2025) for Osteoarthritis.   Waddell CHRISTELLA Craze, PA-C  Note - This record has been created using Dragon software.  Chart creation errors have been sought, but may not always  have been located. Such creation errors do not reflect on  the standard of medical care.

## 2024-09-02 ENCOUNTER — Encounter: Payer: Self-pay | Admitting: Physician Assistant

## 2024-09-02 ENCOUNTER — Telehealth: Payer: Self-pay

## 2024-09-02 ENCOUNTER — Ambulatory Visit: Attending: Physician Assistant | Admitting: Physician Assistant

## 2024-09-02 VITALS — BP 143/86 | HR 74 | Temp 97.8°F | Resp 12 | Ht 62.5 in | Wt 131.6 lb

## 2024-09-02 DIAGNOSIS — M170 Bilateral primary osteoarthritis of knee: Secondary | ICD-10-CM | POA: Diagnosis not present

## 2024-09-02 DIAGNOSIS — R5383 Other fatigue: Secondary | ICD-10-CM

## 2024-09-02 DIAGNOSIS — S76011D Strain of muscle, fascia and tendon of right hip, subsequent encounter: Secondary | ICD-10-CM | POA: Diagnosis not present

## 2024-09-02 DIAGNOSIS — M19071 Primary osteoarthritis, right ankle and foot: Secondary | ICD-10-CM

## 2024-09-02 DIAGNOSIS — M797 Fibromyalgia: Secondary | ICD-10-CM

## 2024-09-02 DIAGNOSIS — F5101 Primary insomnia: Secondary | ICD-10-CM

## 2024-09-02 DIAGNOSIS — M19041 Primary osteoarthritis, right hand: Secondary | ICD-10-CM | POA: Diagnosis not present

## 2024-09-02 DIAGNOSIS — M7061 Trochanteric bursitis, right hip: Secondary | ICD-10-CM

## 2024-09-02 DIAGNOSIS — M19042 Primary osteoarthritis, left hand: Secondary | ICD-10-CM

## 2024-09-02 DIAGNOSIS — D72819 Decreased white blood cell count, unspecified: Secondary | ICD-10-CM

## 2024-09-02 DIAGNOSIS — G8929 Other chronic pain: Secondary | ICD-10-CM

## 2024-09-02 DIAGNOSIS — M19072 Primary osteoarthritis, left ankle and foot: Secondary | ICD-10-CM

## 2024-09-02 DIAGNOSIS — Z8639 Personal history of other endocrine, nutritional and metabolic disease: Secondary | ICD-10-CM

## 2024-09-02 DIAGNOSIS — M8589 Other specified disorders of bone density and structure, multiple sites: Secondary | ICD-10-CM

## 2024-09-02 DIAGNOSIS — L853 Xerosis cutis: Secondary | ICD-10-CM

## 2024-09-02 NOTE — Telephone Encounter (Signed)
 Labs received from:PCP  Drawn on:07/07/2024  Reviewed by:Waddell Craze, PA-C  Labs drawn:CBC, Hemoglobin A1c, Vitamin D, CMP, and Lipid Panel  Results:WBC 3.5 Hemoglobin A1c 5.7 Albumin 4.9 Calc LDL 100

## 2024-09-04 NOTE — Progress Notes (Unsigned)
 [facility] CONSULT NOTE  Patient Care Team: Dyane Anthony RAMAN, FNP as PCP - General (Family Medicine)   ASSESSMENT & PLAN 63 y.o.female with history of hypertension, hyperlipidemia, atherosclerosis, insomnia, fibromyalgia, osteoarthritis referred for decreased white blood cell counts.  Outside labs show borderline decrease total white blood cell count right below lower range of normal.  ANC was normal.  No other concerning findings. Assessment & Plan   No orders of the defined types were placed in this encounter.    Check CBC, blood smear B12, folate, copper HIV, viral hepatitis, ANA, ESR, CRP Check RF, if Rheumatoid arthritis then check flow for LGL    All questions were answered. The patient knows to call the clinic with any problems, questions or concerns.  I personally spent a total of *** minutes in the care of the patient today including {Time Based Coding:[id number]}.    Desiree JAYSON Chihuahua, MD 09/04/2024 8:56 AM   CHIEF COMPLAINTS/PURPOSE OF CONSULTATION:  Leukopenia   HISTORY OF PRESENTING ILLNESS:  Desiree Randall 63 y.o. female is here because of decreased WBC. She was referred from [facility], Anthony Dyane FNP.  06/28/23 WBC 3.9 hemoglobin 14 MCV 96 platelet 226 07/07/24 WBC 3.5 hemoglobin 15 MCV 92 platelet 250 08/03/24 WBC 3.4 hemoglobin 14 MCV 92 platelet 216 ANC 1.7  This was followed annual exam for wellness visit.  Medical history including hypertension on losartan, hyperlipidemia on rosuvastatin, osteoarthritis.  Report fibromyalgia on Cymbalta .  She takes zolpidem  for insomnia.   {Yes/No:30480221}: family history of immunodeficiency or bone marrow failure syndrome {Yes/No:30480221}: family history of early childhood death or failure to thrive {Yes/No:30480221}: dietary deficiency and B12, folate, copper {Yes/No:30480221}: history of autoimmune disorders, such as lupus or RA {Yes/No:30480221}: risk factors for  HIV {Yes/No:30480221}: heavy alcohol use {Yes/No:30480221}: recent infections {Yes/No:30480221}: thymoma {Yes/No:30480221}: lymph node swelling, fever, drenching night sweats, unintentional weight loss Medications: {Yes/No:30480221}: antithyroid drugs {Yes/No:30480221}: NSAIDs or other in anti-inflammatory drugs  {Yes/No:30480221}: Tricyclic antidepressant or other antidepressant, clozapine, phenothiazine  {Yes/No:30480221}: GI: Sulfasalazine or H2 blocker  {Yes/No:30480221}: CV: Antiarrhythmic agents, ticlopidine, ACE inhibitors, propranolol, digoxin, dipyridamole  {Yes/No:30480221}: Derm: Dapsone, isotretinoin  {Yes/No:30480221}: anticonvulsants: Carbamazepine, phenytoin, valproate, Zarontin  {Yes/No:30480221}: antibiotics or antifungal  {Yes/No:30480221}: antimalarial drugs  {Yes/No:30480221}: diuretics  {Yes/No:30480221}: sulfonylurea  {Yes/No:30480221}: iron chelating agents: Deferiprone (Ferriprox)  {Yes/No:30480221}: cocaine or heroin use    MEDICAL HISTORY:  Past Medical History:  Diagnosis Date   Aortic atherosclerosis    Basal cell carcinoma (BCC) of chest    Fibromyalgia    GERD (gastroesophageal reflux disease)    High cholesterol    Hypercholesterolemia    Hypertension    Osteoarthritis    Osteopenia    Other insomnia    Pneumonia    Seasonal allergies     SURGICAL HISTORY: Past Surgical History:  Procedure Laterality Date   HYSTEROTOMY  2002   KNEE SURGERY  1990-1991   OPEN SURGICAL REPAIR OF GLUTEAL TENDON Right 06/08/2022   Procedure: OPEN SURGICAL REPAIR OF GLUTEAL TENDON;  Surgeon: Sharl Selinda Dover, MD;  Location: [facility];  Service: Orthopedics;  Laterality: Right;  90   OVARY SURGERY  2003   LEFT OVARY REMOVAL   SKIN CANCER EXCISION     toe joint replacement Bilateral 03/2018   TUBAL LIGATION  2003   LEFT FALLOPIAN TUBE REMOVED    SOCIAL HISTORY: Social History   Socioeconomic History   Marital status: Married    Spouse name: Not on  file   Number of  children: Not on file   Years of education: Not on file   Highest education level: Not on file  Occupational History   Not on file  Tobacco Use   Smoking status: Never    Passive exposure: Never   Smokeless tobacco: Never  Vaping Use   Vaping status: Never Used  Substance and Sexual Activity   Alcohol use: Yes    Alcohol/week: 0.0 standard drinks of alcohol    Comment: rarely   Drug use: No   Sexual activity: Yes    Birth control/protection: Surgical  Other Topics Concern   Not on file  Social History Narrative   Not on file   Social Drivers of Health   Financial Resource Strain: Not on file  Food Insecurity: Not on file  Transportation Needs: Not on file  Physical Activity: Not on file  Stress: Not on file  Social Connections: Not on file  Intimate Partner Violence: Not on file    FAMILY HISTORY: Family History  Problem Relation Age of Onset   Melanoma Mother    Heart disease Mother    Hypertension Mother    Heart attack Mother    Heart disease Father        TRIPLE BYPASS   Hypercholesterolemia Father    Kidney Stones Brother    Heart failure Maternal Grandmother    Heart attack Maternal Grandfather    Stroke Maternal Grandfather    Aneurysm Maternal Grandfather    Heart attack Paternal Grandmother    Heart disease Paternal Grandfather    Breast cancer Neg Hx     ALLERGIES:  is allergic to tape.  MEDICATIONS:  Current Outpatient Medications  Medication Sig Dispense Refill   Aluminum & Magnesium Hydroxide (MAGNESIUM-ALUMINUM PO) Take by mouth.     Azelastine HCl (ASTEPRO) 0.15 % SOLN Place 1 spray into the nose daily as needed (allergies).     Biotin w/ Vitamins C & E (HAIR/SKIN/NAILS PO) Take 1 tablet by mouth daily.     Calcium Carb-Cholecalciferol (CALCIUM 600/VITAMIN D3 PO) Take 1 tablet by mouth daily.     diclofenac  sodium (VOLTAREN ) 1 % GEL Apply 3 g to 3 large joints up to 3 times daily (Patient taking differently: Apply 3 g  topically 3 (three) times daily as needed (pain).) 3 Tube 3   DULoxetine  (CYMBALTA ) 60 MG capsule TAKE 1 CAPSULE BY MOUTH DAILY 90 capsule 0   losartan (COZAAR) 50 MG tablet Take 50 mg by mouth daily.     losartan-hydrochlorothiazide (HYZAAR) 100-25 MG tablet  (Patient not taking: Reported on 09/02/2024)     montelukast (SINGULAIR) 10 MG tablet 1 tablet Orally Once a day for 30 days     Multiple Vitamins-Minerals (MULTIVITAMIN WITH MINERALS) tablet Take 1 tablet by mouth daily. (Patient not taking: Reported on 09/02/2024)     Olopatadine HCl (PATADAY) 0.2 % SOLN 1 drop into both eyes Ophthalmic Once a day for 30 days     Omega-3 Fatty Acids (FISH OIL PO) Take by mouth daily.     Red Yeast Rice 600 MG CAPS Take 1,200 mg by mouth daily. (Patient not taking: Reported on 09/02/2024)     rosuvastatin (CRESTOR) 5 MG tablet 1 tablet Orally Once a day for 90 days     TURMERIC PO Take by mouth. Turmeric, Ginger, black pepper     zolpidem  (AMBIEN ) 5 MG tablet TAKE 1 TO 2 TABLETS BY MOUTH EVERY NIGHT AT BEDTIME AS NEEDED FOR INSOMNIA 60 tablet 0   No  current facility-administered medications for this visit.    REVIEW OF SYSTEMS:   All relevant systems were reviewed with the patient and are negative.  PHYSICAL EXAMINATION: ECOG PERFORMANCE STATUS: {CHL ONC ECOG PS:[id number]}  There were no vitals filed for this visit. There were no vitals filed for this visit.  GENERAL: alert, no distress and comfortable SKIN: skin color normal and no bruising or petechiae on exposed skin EYES: normal, sclera clear OROPHARYNX: no exudate  NECK: No palpable mass LYMPH:  no palpable cervical, axillary or inguinal lymphadenopathy  LUNGS: clear to auscultation and percussion with normal breathing effort HEART: regular rate & rhythm and no murmurs  ABDOMEN: abdomen soft, non-tender and nondistended. Musculoskeletal: no edema NEURO: no focal motor/sensory deficits  LABORATORY DATA:  I have reviewed the data as  listed No results found for this or any previous visit (from the past 2160 hours).  RADIOGRAPHIC STUDIES: I have personally reviewed the radiological images as listed and agreed with the findings in the report. No results found.

## 2024-09-09 ENCOUNTER — Inpatient Hospital Stay

## 2024-09-09 ENCOUNTER — Ambulatory Visit: Payer: Self-pay

## 2024-09-09 VITALS — BP 132/58 | HR 94 | Temp 97.5°F | Resp 18 | Ht 62.5 in | Wt 134.2 lb

## 2024-09-09 DIAGNOSIS — I10 Essential (primary) hypertension: Secondary | ICD-10-CM | POA: Diagnosis not present

## 2024-09-09 DIAGNOSIS — D72819 Decreased white blood cell count, unspecified: Secondary | ICD-10-CM

## 2024-09-09 DIAGNOSIS — M8589 Other specified disorders of bone density and structure, multiple sites: Secondary | ICD-10-CM | POA: Diagnosis not present

## 2024-09-09 LAB — CBC WITH DIFFERENTIAL (CANCER CENTER ONLY)
Abs Immature Granulocytes: 0.01 K/uL (ref 0.00–0.07)
Basophils Absolute: 0.1 K/uL (ref 0.0–0.1)
Basophils Relative: 1 %
Eosinophils Absolute: 0.2 K/uL (ref 0.0–0.5)
Eosinophils Relative: 3 %
HCT: 40.1 % (ref 36.0–46.0)
Hemoglobin: 13.6 g/dL (ref 12.0–15.0)
Immature Granulocytes: 0 %
Lymphocytes Relative: 24 %
Lymphs Abs: 1.3 K/uL (ref 0.7–4.0)
MCH: 31.5 pg (ref 26.0–34.0)
MCHC: 33.9 g/dL (ref 30.0–36.0)
MCV: 92.8 fL (ref 80.0–100.0)
Monocytes Absolute: 0.6 K/uL (ref 0.1–1.0)
Monocytes Relative: 11 %
Neutro Abs: 3.3 K/uL (ref 1.7–7.7)
Neutrophils Relative %: 61 %
Platelet Count: 238 K/uL (ref 150–400)
RBC: 4.32 MIL/uL (ref 3.87–5.11)
RDW: 12.7 % (ref 11.5–15.5)
WBC Count: 5.5 K/uL (ref 4.0–10.5)
nRBC: 0 % (ref 0.0–0.2)

## 2024-09-09 LAB — FOLATE: Folate: 9.1 ng/mL (ref >5.9)

## 2024-09-09 LAB — VITAMIN B12: Vitamin B-12: 518 pg/mL (ref 180–914)

## 2024-09-09 NOTE — Assessment & Plan Note (Addendum)
 Borderline with normal ANC. CBC, folate and B12.

## 2024-09-14 NOTE — Progress Notes (Unsigned)
 Cardiology Office Note:   Date:  09/17/2024  ID:  Desiree Randall, DOB 05-30-1961, MRN [id number] PCP:  Dyane Anthony RAMAN, FNP  [facility] Providers Cardiologist:  Wendel Haws, MD Referring MD: Dyane Anthony RAMAN, FNP  Chief Complaint/Reason for Referral: Aortic atherosclerosis, chest pain ASSESSMENT:    1. Precordial pain   2. Hyperlipidemia LDL goal <70   3. Aortic atherosclerosis   4. Primary hypertension     PLAN:   In order of problems listed above: Chest pain:  We will obtain a coronary CTA and echocardiogram to evaluate further.  If the patient has mild obstructive coronary artery disease, they will require a statin (with goal LDL < 70) and aspirin, if they have high-grade disease we will need to consider optimal medical therapy and if symptoms are refractory to medical therapy, then a cardiac catheterization with possible PCI will be pursued to alleviate symptoms.  If they have high risk disease we will proceed directly to cardiac catheterization.   Hyperlipidemia: Changed to atorvastatin 20 mg.  Check lipid panel, LFTs, LP(a) in 2 months Aortic atherosclerosis: Start aspirin 81 mg, changed to atorvastatin 20 mg. Hypertension: Increase losartan to 100 mg.            Dispo:  Return in about 6 months (around 03/18/2025).       I spent 40 minutes reviewing all clinical data during and prior to this visit including all relevant imaging studies, laboratories, clinical information from other health systems and prior notes from both Cardiology and other specialties, interviewing the patient, conducting a complete physical examination, and coordinating care in order to formulate a comprehensive and personalized evaluation and treatment plan.   History of Present Illness:    FOCUSED PROBLEM LIST:   CAC 0 Calcium score CT 2024 Aortic atherosclerosis Calcium score CT 2024 Hyperlipidemia Hypertension Fibromyalgia Osteoarthritis BMI 10 September 2024:  Patient  consents to use of AI scribe. Patient is a 63 year old female with above listed medical problems referred for recommendations regarding aortic atherosclerosis and occasional chest discomfort.  She experiences chest discomfort described as occasional tightness, often questioning if it is related to exercise, stress, or other factors. She has a strong family history of heart disease and experiences heart palpitations, particularly during physical exertion such as climbing stairs or exercising. A recent period of high stress due to her mother's passing in June may have contributed to her symptoms. Her symptoms have improved since then, especially after engaging in significant physical activity during a trip to [place] in September without major issues.  She has been on a statin since her calcium score CT a year ago, which showed a zero calcium score. Initially, she experienced severe joint pain, which she attributes to either a flare-up of her fibromyalgia or a reaction to the statin. The joint pain subsided after about three weeks, but she continues to experience daily joint pain of varying intensity. She takes losartan for high blood pressure and reports high blood pressure readings during recent doctor's visits, although she has not been checking it regularly at home. She attributes some of the high readings to stress and caffeine intake.  Her past medical history includes osteoarthritis, fibromyalgia, and a history of low white blood cell count, which normalized after a year. Her family history includes rheumatoid arthritis, as her grandmother had the condition. Her mother had achinetic rigid Parkinson's disease and passed away recently. Her mother's sister, who is close in age to her, recently underwent a heart ablation for atrial  fibrillation.  Socially, she is a retired theatre manager and describes herself as an active person who tries to maintain a healthy lifestyle through diet and exercise.  She does not smoke and drinks alcohol minimally, primarily enjoying red wine. She recently visited [place] and engaged in wine tasting and biking activities.     Current Medications: Current Meds  Medication Sig   Aluminum & Magnesium Hydroxide (MAGNESIUM-ALUMINUM PO) Take by mouth.   Azelastine HCl (ASTEPRO) 0.15 % SOLN Place 1 spray into the nose daily as needed (allergies).   Biotin w/ Vitamins C & E (HAIR/SKIN/NAILS PO) Take 1 tablet by mouth daily.   Calcium Carb-Cholecalciferol (CALCIUM 600/VITAMIN D3 PO) Take 1 tablet by mouth daily.   diclofenac  sodium (VOLTAREN ) 1 % GEL Apply 3 g to 3 large joints up to 3 times daily (Patient taking differently: Apply 3 g topically 3 (three) times daily as needed (pain).)   DULoxetine  (CYMBALTA ) 60 MG capsule TAKE 1 CAPSULE BY MOUTH DAILY   losartan (COZAAR) 50 MG tablet Take 50 mg by mouth daily.   Olopatadine HCl (PATADAY) 0.2 % SOLN 1 drop into both eyes Ophthalmic Once a day for 30 days   Omega-3 Fatty Acids (FISH OIL PO) Take by mouth daily.   TURMERIC PO Take by mouth. Turmeric, Ginger, black pepper   zolpidem  (AMBIEN ) 5 MG tablet TAKE 1 TO 2 TABLETS BY MOUTH EVERY NIGHT AT BEDTIME AS NEEDED FOR INSOMNIA   [DISCONTINUED] rosuvastatin (CRESTOR) 5 MG tablet 1 tablet Orally Once a day for 90 days     Review of Systems:   Please see the history of present illness.    All other systems reviewed and are negative.     EKGs/Labs/Other Test Reviewed:   EKG: 2023 normal sinus rhythm nonspecific T wave abnormality  EKG Interpretation Date/Time:  Thursday September 17 2024 09:51:23 EDT Ventricular Rate:  77 PR Interval:  144 QRS Duration:  74 QT Interval:  358 QTC Calculation: 405 R Axis:   23  Text Interpretation: Normal sinus rhythm Normal ECG When compared with ECG of 05-Jun-2022 13:24, No significant change was found Confirmed by Wendel Haws ([id number]) on 09/17/2024 9:54:03 AM        CARDIAC STUDIES: Refer to CV Procedures and Imaging  Tabs   Risk Assessment/Calculations:          Physical Exam:   VS:  BP (!) 150/80   Pulse 77   Ht 5' 3 (1.6 m)   Wt 134 lb (60.8 kg)   SpO2 96%   BMI 23.74 kg/m    HYPERTENSION CONTROL Vitals:   09/17/24 0948 09/17/24 1016  BP: (!) 158/88 (!) 150/80    The patient's blood pressure is elevated above target today.  In order to address the patient's elevated BP: A current anti-hypertensive medication was adjusted today.      Wt Readings from Last 3 Encounters:  09/17/24 134 lb (60.8 kg)  09/09/24 134 lb 3.2 oz (60.9 kg)  09/02/24 131 lb 9.6 oz (59.7 kg)      GENERAL:  No apparent distress, AOx3 HEENT:  No carotid bruits, +2 carotid impulses, no scleral icterus CAR: RRR no murmurs, gallops, rubs, or thrills RES:  Clear to auscultation bilaterally ABD:  Soft, nontender, nondistended, positive bowel sounds x 4 VASC:  +2 radial pulses, +2 carotid pulses NEURO:  CN 2-12 grossly intact; motor and sensory grossly intact PSYCH:  No active depression or anxiety EXT:  No edema, ecchymosis, or cyanosis  Signed, Haws  MARLA Red, MD  09/17/2024 10:17 AM    [facility] [address] [place], [place], [place]  [address] Phone: [phone]; Fax: [phone]   Note:  This document was prepared using Dragon voice recognition software and may include unintentional dictation errors.

## 2024-09-15 ENCOUNTER — Other Ambulatory Visit: Payer: Self-pay | Admitting: Rheumatology

## 2024-09-15 NOTE — Telephone Encounter (Signed)
 Last Fill: 08/14/2024  Next Visit: 03/05/2025  Last Visit: 09/02/2024  DX: Primary insomnia   Current Dose per office note on 09/02/2024: Ambien  5 to 10 mg at bedtime as needed.   Okay to refill ambien ?

## 2024-09-17 ENCOUNTER — Other Ambulatory Visit: Payer: Self-pay

## 2024-09-17 ENCOUNTER — Encounter: Payer: Self-pay | Admitting: Internal Medicine

## 2024-09-17 ENCOUNTER — Ambulatory Visit: Attending: Internal Medicine | Admitting: Internal Medicine

## 2024-09-17 VITALS — BP 150/80 | HR 77 | Ht 63.0 in | Wt 134.0 lb

## 2024-09-17 DIAGNOSIS — I10 Essential (primary) hypertension: Secondary | ICD-10-CM

## 2024-09-17 DIAGNOSIS — R072 Precordial pain: Secondary | ICD-10-CM | POA: Diagnosis not present

## 2024-09-17 DIAGNOSIS — E785 Hyperlipidemia, unspecified: Secondary | ICD-10-CM | POA: Diagnosis not present

## 2024-09-17 DIAGNOSIS — I700 Atherosclerosis of aorta: Secondary | ICD-10-CM | POA: Diagnosis not present

## 2024-09-17 MED ORDER — ASPIRIN 81 MG PO TBEC: 81.0000 mg | DELAYED_RELEASE_TABLET | Freq: Every day | ORAL | Status: AC

## 2024-09-17 MED ORDER — METOPROLOL TARTRATE 100 MG PO TABS: 100.0000 mg | ORAL_TABLET | Freq: Once | ORAL | 0 refills | Status: AC

## 2024-09-17 MED ORDER — LOSARTAN POTASSIUM 100 MG PO TABS: 100.0000 mg | ORAL_TABLET | Freq: Every day | ORAL | 3 refills | Status: AC

## 2024-09-17 MED ORDER — ATORVASTATIN CALCIUM 20 MG PO TABS: 20.0000 mg | ORAL_TABLET | Freq: Every day | ORAL | 3 refills | Status: AC

## 2024-09-17 NOTE — Patient Instructions (Signed)
 Medication Instructions:  Your physician has recommended you make the following change in your medication:   1) STOP rosuvastatin (Crestor) --this has been added to your allergy list-- 2) START atorvastatin (Lipitor) 20 mg daily 3) START aspirin 81 mg daily (can be purchased over the counter) 4) INCREASE losartan to 100 mg daily  *If you need a refill on your cardiac medications before your next appointment, please call your pharmacy*  Lab Work: In 2 months (end of December/early January) go to lab for: Lipid panel, LFTs, LP(a)  You may go to any of these [facility] locations: [facility] - [address] ([facility]) - [address] [phone] [address]. [address] - [address] (1st floor, next to pharmacy)  If you have labs (blood work) drawn today and your tests are completely normal, you will receive your results only by: [employer] (if you have MyChart) OR A paper copy in the mail If you have any lab test that is abnormal or we need to change your treatment, we will call you to review the results.  Testing/Procedures: Your physician has requested that you have cardiac CT. Cardiac computed tomography (CT) is a painless test that uses an x-ray machine to take clear, detailed pictures of your heart. For further information please visit [email or web address] Please follow instruction sheet as given.   Follow-Up: At [facility], you and your health needs are our priority.  As part of our continuing mission to provide you with exceptional heart care, our providers are all part of one team.  This team includes your primary Cardiologist (physician) and Advanced Practice Providers or APPs (Physician Assistants and Nurse Practitioners) who all work together to provide you with the care you need, when you need it.  Your next appointment:   6 month(s)  Provider:   Arun K Thukkani, MD   We recommend signing up for the patient  portal called MyChart.  Sign up information is provided on this After Visit Summary.  MyChart is used to connect with patients for Virtual Visits (Telemedicine).  Patients are able to view lab/test results, encounter notes, upcoming appointments, etc.  Non-urgent messages can be sent to your provider as well.    To learn more about what you can do with MyChart, go to [email or web address].   Other Instructions   Your cardiac CT will be scheduled at one of the below locations:   [facility] [address] [place], [place] [address] [phone] (Severe contrast allergies only)  OR   [facility] [address] [place], [place] [address] [phone]  OR   [facility] [address] [place], [place] [address] [phone]  OR   [facility] and Vascular Tower [address]  [place], [place] [address]  OR   [facility] [address] [place], [place] [phone]  If scheduled at [facility], please arrive at the [facility] and Children's Entrance (Entrance C2) of [facility] 30 minutes prior to test start time. You can use the FREE valet parking offered at entrance C (encouraged to control the heart rate for the test)  Proceed to the [facility] Radiology Department (first floor) to check-in and test prep.  All radiology patients and guests should use entrance C2 at [facility], accessed from [facility], even though the hospital's physical address listed is [address].  If scheduled at the  Heart and Vascular Tower at [address], please enter the parking lot using the [address] entrance and use the FREE valet service at the patient drop-off area. Enter the building and check-in with registration on the main floor.  If scheduled at [facility], please arrive to the Heart and Vascular Center 15 mins early for check-in and test  prep.  There is spacious parking and easy access to the radiology department from the [facility] Heart and Vascular entrance. Please enter here and check-in with the desk attendant.   If scheduled at [facility], please arrive 30 minutes early for check-in and test prep.  Please follow these instructions carefully (unless otherwise directed):  An IV will be required for this test and Nitroglycerin will be given.   On the Night Before the Test: Be sure to Drink plenty of water. Do not consume any caffeinated/decaffeinated beverages or chocolate 12 hours prior to your test. Do not take any antihistamines 12 hours prior to your test.  On the Day of the Test: Drink plenty of water until 1 hour prior to the test. Do not eat any food 1 hour prior to test. You may take your regular medications prior to the test.  Take metoprolol (Lopressor) 100 mg two hours prior to test. If you take Furosemide/Hydrochlorothiazide/Spironolactone/Chlorthalidone, please HOLD on the morning of the test. Patients who wear a continuous glucose monitor MUST remove the device prior to scanning. FEMALES- please wear underwire-free bra if available, avoid dresses & tight clothing  After the Test: Drink plenty of water. After receiving IV contrast, you may experience a mild flushed feeling. This is normal. On occasion, you may experience a mild rash up to 24 hours after the test. This is not dangerous. If this occurs, you can take Benadryl 25 mg, Zyrtec, Claritin, or Allegra and increase your fluid intake. (Patients taking Tikosyn should avoid Benadryl, and may take Zyrtec, Claritin, or Allegra) If you experience trouble breathing, this can be serious. If it is severe call 911 IMMEDIATELY. If it is mild, please call our office.  We will call to schedule your test 2-4 weeks out understanding that some insurance companies will need an authorization prior to the service being performed.   For more information and  frequently asked questions, please visit our website : [email or web address]  For non-scheduling related questions, please contact the cardiac imaging nurse navigator should you have any questions/concerns: Cardiac Imaging Nurse Navigators Direct Office Dial: [phone]   For scheduling needs, including cancellations and rescheduling, please call Brittany, [phone].

## 2024-09-22 ENCOUNTER — Encounter (HOSPITAL_COMMUNITY): Payer: Self-pay

## 2024-10-05 ENCOUNTER — Other Ambulatory Visit: Payer: Self-pay

## 2024-10-05 MED ORDER — DULOXETINE HCL 60 MG PO CPEP: 60.0000 mg | ORAL_CAPSULE | Freq: Every day | ORAL | 0 refills | Status: AC

## 2024-10-05 NOTE — Telephone Encounter (Signed)
 Refill request received via fax from [facility] for Cymbalta   Last Fill: 07/07/2024  Next Visit: 03/05/2025  Last Visit: 09/02/2024  Dx: Fibromyalgia   Current Dose per office note on 09/02/2024: Cymbalta  60 mg 1 capsule daily.   Okay to refill Cymbalta ?

## 2024-10-13 ENCOUNTER — Encounter (HOSPITAL_COMMUNITY): Payer: Self-pay

## 2024-10-14 ENCOUNTER — Other Ambulatory Visit: Payer: Self-pay

## 2024-10-14 MED ORDER — ZOLPIDEM TARTRATE 5 MG PO TABS: ORAL_TABLET | ORAL | 0 refills | Status: DC

## 2024-10-14 NOTE — Telephone Encounter (Signed)
 Last Fill: 09/15/2024  Next Visit: 03/05/2025  Last Visit: 09/02/2024  DX: Primary insomnia   Current Dose per office note on 09/02/2024: Ambien  5 to 10 mg at bedtime as needed.   Okay to refill ambien ?

## 2024-10-19 ENCOUNTER — Ambulatory Visit (HOSPITAL_COMMUNITY)
Admission: RE | Admit: 2024-10-19 | Discharge: 2024-10-19 | Disposition: A | Discharge status: Home / Self Care | Source: Ambulatory Visit | Attending: Internal Medicine | Admitting: Internal Medicine

## 2024-10-19 DIAGNOSIS — R072 Precordial pain: Secondary | ICD-10-CM | POA: Insufficient documentation

## 2024-10-19 MED ORDER — METOPROLOL TARTRATE 5 MG/5ML IV SOLN
10.0000 mg | Freq: Once | INTRAVENOUS | Status: AC | PRN
  Administered 2024-10-19: 10 mg via INTRAVENOUS

## 2024-10-19 MED ORDER — NITROGLYCERIN 0.4 MG SL SUBL
0.8000 mg | SUBLINGUAL_TABLET | Freq: Once | SUBLINGUAL | Status: AC
  Administered 2024-10-19: 0.8 mg via SUBLINGUAL

## 2024-10-19 MED ORDER — DILTIAZEM HCL 25 MG/5ML IV SOLN: 10.0000 mg | INTRAVENOUS | Status: DC | PRN

## 2024-10-19 MED ORDER — IOHEXOL 350 MG/ML SOLN
100.0000 mL | Freq: Once | INTRAVENOUS | Status: AC | PRN
  Administered 2024-10-19: 100 mL via INTRAVENOUS

## 2024-10-20 ENCOUNTER — Ambulatory Visit: Payer: Self-pay | Admitting: Internal Medicine

## 2024-11-16 ENCOUNTER — Other Ambulatory Visit: Payer: Self-pay | Admitting: Physician Assistant

## 2024-11-16 NOTE — Telephone Encounter (Signed)
 Last Fill: 10/14/2024  Next Visit: 03/05/2025  Last Visit: 09/02/2024  DX: Primary insomnia   Current Dose per office note on 09/02/2024: She takes Ambien  5 to 10 mg at bedtime as needed.   Okay to refill ambien ?

## 2024-12-17 ENCOUNTER — Other Ambulatory Visit: Payer: Self-pay | Admitting: Physician Assistant

## 2024-12-17 NOTE — Telephone Encounter (Signed)
 Last Fill: 11/16/2024   Next Visit: 03/05/2025   Last Visit: 09/02/2024   DX: Primary insomnia    Current Dose per office note on 09/02/2024: She takes Ambien  5 to 10 mg at bedtime as needed.    Okay to refill ambien ?

## 2025-03-05 ENCOUNTER — Ambulatory Visit: Admitting: Rheumatology
